# Patient Record
Sex: Female | Born: 1980 | Race: White | Hispanic: Yes | Marital: Single | Smoking: Never smoker
Health system: Southern US, Community
[De-identification: ages and names within clinical notes are randomized; demographics above are authoritative.]

## PROBLEM LIST (undated history)

## (undated) DIAGNOSIS — E7800 Pure hypercholesterolemia, unspecified: Secondary | ICD-10-CM

## (undated) DIAGNOSIS — E079 Disorder of thyroid, unspecified: Secondary | ICD-10-CM

## (undated) HISTORY — DX: Pure hypercholesterolemia, unspecified: E78.00

## (undated) HISTORY — DX: Disorder of thyroid, unspecified: E07.9

---

## 2006-08-25 ENCOUNTER — Emergency Department (HOSPITAL_COMMUNITY): Admission: EM | Admit: 2006-08-25 | Discharge: 2006-08-25 | Payer: Self-pay | Admitting: Emergency Medicine

## 2006-10-27 ENCOUNTER — Emergency Department (HOSPITAL_COMMUNITY): Admission: EM | Admit: 2006-10-27 | Discharge: 2006-10-28 | Payer: Self-pay | Admitting: Emergency Medicine

## 2007-05-08 ENCOUNTER — Emergency Department (HOSPITAL_COMMUNITY): Admission: EM | Admit: 2007-05-08 | Discharge: 2007-05-08 | Payer: Self-pay | Admitting: Emergency Medicine

## 2007-08-03 ENCOUNTER — Emergency Department (HOSPITAL_COMMUNITY): Admission: EM | Admit: 2007-08-03 | Discharge: 2007-08-03 | Payer: Self-pay | Admitting: Emergency Medicine

## 2007-10-23 ENCOUNTER — Emergency Department (HOSPITAL_COMMUNITY): Admission: EM | Admit: 2007-10-23 | Discharge: 2007-10-23 | Payer: Self-pay | Admitting: Emergency Medicine

## 2007-10-25 ENCOUNTER — Emergency Department (HOSPITAL_COMMUNITY): Admission: EM | Admit: 2007-10-25 | Discharge: 2007-10-25 | Payer: Self-pay | Admitting: Emergency Medicine

## 2009-07-06 ENCOUNTER — Emergency Department (HOSPITAL_COMMUNITY): Admission: EM | Admit: 2009-07-06 | Discharge: 2009-07-06 | Payer: Self-pay | Admitting: Emergency Medicine

## 2009-12-03 ENCOUNTER — Inpatient Hospital Stay (HOSPITAL_COMMUNITY): Admission: EM | Admit: 2009-12-03 | Discharge: 2009-12-04 | Payer: Self-pay | Admitting: Emergency Medicine

## 2009-12-03 ENCOUNTER — Ambulatory Visit: Payer: Self-pay | Admitting: Family Medicine

## 2010-01-06 ENCOUNTER — Emergency Department (HOSPITAL_COMMUNITY): Admission: EM | Admit: 2010-01-06 | Discharge: 2010-01-07 | Payer: Self-pay | Admitting: Emergency Medicine

## 2010-11-15 LAB — BASIC METABOLIC PANEL
CO2: 23 mEq/L (ref 19–32)
Chloride: 101 mEq/L (ref 96–112)
GFR calc Af Amer: >60 mL/min (ref >60)
GFR calc non Af Amer: >60 mL/min (ref >60)

## 2010-11-15 LAB — HEPATIC FUNCTION PANEL
AST: 17 U/L (ref 0–37)
Albumin: 3.6 g/dL (ref 3.5–5.2)
Alkaline Phosphatase: 74 U/L (ref 39–117)

## 2010-11-15 LAB — POCT I-STAT, CHEM 8
Calcium, Ion: 1.1 mmol/L — ABNORMAL LOW (ref 1.12–1.32)
Creatinine, Ser: 0.7 mg/dL (ref 0.4–1.2)
Glucose, Bld: 103 mg/dL — ABNORMAL HIGH (ref 70–99)
HCT: 51 % — ABNORMAL HIGH (ref 36.0–46.0)
Hemoglobin: 17.3 g/dL — ABNORMAL HIGH (ref 12.0–15.0)
TCO2: 25 mmol/L (ref 0–100)

## 2010-11-15 LAB — URINALYSIS, ROUTINE W REFLEX MICROSCOPIC
Bilirubin Urine: NEGATIVE
Ketones, ur: 15 mg/dL — AB
Nitrite: NEGATIVE
Specific Gravity, Urine: 1.006 (ref 1.005–1.030)

## 2010-11-15 LAB — LIPASE, BLOOD: Lipase: 18 U/L (ref 11–59)

## 2010-11-15 LAB — CBC
Platelets: 307 10*3/uL (ref 150–400)
RBC: 4.98 MIL/uL (ref 3.87–5.11)
WBC: 12.8 10*3/uL — ABNORMAL HIGH (ref 4.0–10.5)

## 2010-11-15 LAB — DIFFERENTIAL
Basophils Relative: 0 % (ref 0–1)
Eosinophils Relative: 0 % (ref 0–5)
Lymphocytes Relative: 4 % — ABNORMAL LOW (ref 12–46)

## 2010-11-15 LAB — URINE MICROSCOPIC-ADD ON

## 2010-11-15 LAB — POCT PREGNANCY, URINE: Preg Test, Ur: NEGATIVE

## 2010-11-16 LAB — CBC
HCT: 38.2 % (ref 36.0–46.0)
Hemoglobin: 13 g/dL (ref 12.0–15.0)
Hemoglobin: 14.3 g/dL (ref 12.0–15.0)
MCV: 87.1 fL (ref 78.0–100.0)
MCV: 87.4 fL (ref 78.0–100.0)
Platelets: 259 10*3/uL (ref 150–400)
RBC: 4.38 MIL/uL (ref 3.87–5.11)
RDW: 14.2 % (ref 11.5–15.5)
WBC: 10.5 10*3/uL (ref 4.0–10.5)
WBC: 11.4 10*3/uL — ABNORMAL HIGH (ref 4.0–10.5)

## 2010-11-16 LAB — BASIC METABOLIC PANEL
Calcium: 8.9 mg/dL (ref 8.4–10.5)
Calcium: 9 mg/dL (ref 8.4–10.5)
Chloride: 105 mEq/L (ref 96–112)
Creatinine, Ser: 0.65 mg/dL (ref 0.4–1.2)
GFR calc Af Amer: >60 mL/min (ref >60)
GFR calc non Af Amer: >60 mL/min (ref >60)
Glucose, Bld: 145 mg/dL — ABNORMAL HIGH (ref 70–99)
Potassium: 3.4 mEq/L — ABNORMAL LOW (ref 3.5–5.1)
Sodium: 134 mEq/L — ABNORMAL LOW (ref 135–145)
Sodium: 138 mEq/L (ref 135–145)

## 2010-11-16 LAB — DIFFERENTIAL
Basophils Absolute: 0 10*3/uL (ref 0.0–0.1)
Basophils Relative: 0 % (ref 0–1)
Lymphs Abs: 0.5 10*3/uL — ABNORMAL LOW (ref 0.7–4.0)
Monocytes Absolute: 0 10*3/uL — ABNORMAL LOW (ref 0.1–1.0)
Monocytes Relative: 0 % — ABNORMAL LOW (ref 3–12)

## 2010-11-16 LAB — MRSA PCR SCREENING: MRSA by PCR: NEGATIVE

## 2010-11-16 LAB — URINALYSIS, ROUTINE W REFLEX MICROSCOPIC
Hgb urine dipstick: NEGATIVE
Nitrite: NEGATIVE
Specific Gravity, Urine: 1.025 (ref 1.005–1.030)
pH: 5.5 (ref 5.0–8.0)

## 2010-11-16 LAB — PREGNANCY, URINE: Preg Test, Ur: NEGATIVE

## 2011-05-19 LAB — INFLUENZA A+B VIRUS AG-DIRECT(RAPID): Inflenza A Ag: NEGATIVE

## 2011-05-19 LAB — RAPID STREP SCREEN (MED CTR MEBANE ONLY): Streptococcus, Group A Screen (Direct): NEGATIVE

## 2011-08-19 ENCOUNTER — Inpatient Hospital Stay (HOSPITAL_COMMUNITY)
Admission: EM | Admit: 2011-08-19 | Discharge: 2011-08-21 | DRG: 690 | Disposition: A | Discharge status: Home / Self Care | Payer: Self-pay | Attending: Internal Medicine | Admitting: Internal Medicine

## 2011-08-19 ENCOUNTER — Other Ambulatory Visit: Payer: Self-pay

## 2011-08-19 ENCOUNTER — Encounter (HOSPITAL_COMMUNITY): Payer: Self-pay | Admitting: Internal Medicine

## 2011-08-19 DIAGNOSIS — E876 Hypokalemia: Secondary | ICD-10-CM | POA: Diagnosis present

## 2011-08-19 DIAGNOSIS — R509 Fever, unspecified: Secondary | ICD-10-CM | POA: Diagnosis present

## 2011-08-19 DIAGNOSIS — J45909 Unspecified asthma, uncomplicated: Secondary | ICD-10-CM | POA: Diagnosis present

## 2011-08-19 DIAGNOSIS — N12 Tubulo-interstitial nephritis, not specified as acute or chronic: Principal | ICD-10-CM | POA: Diagnosis present

## 2011-08-19 DIAGNOSIS — G43909 Migraine, unspecified, not intractable, without status migrainosus: Secondary | ICD-10-CM | POA: Diagnosis present

## 2011-08-19 DIAGNOSIS — N10 Acute tubulo-interstitial nephritis: Secondary | ICD-10-CM

## 2011-08-19 DIAGNOSIS — R000 Tachycardia, unspecified: Secondary | ICD-10-CM | POA: Diagnosis present

## 2011-08-19 DIAGNOSIS — E860 Dehydration: Secondary | ICD-10-CM | POA: Diagnosis present

## 2011-08-19 LAB — URINALYSIS, ROUTINE W REFLEX MICROSCOPIC
Bilirubin Urine: NEGATIVE
Glucose, UA: NEGATIVE mg/dL
Ketones, ur: NEGATIVE mg/dL
Nitrite: NEGATIVE
Protein, ur: 100 mg/dL — AB
pH: 5.5 (ref 5.0–8.0)

## 2011-08-19 LAB — URINE MICROSCOPIC-ADD ON

## 2011-08-19 LAB — CBC
HCT: 40 % (ref 36.0–46.0)
MCH: 29.2 pg (ref 26.0–34.0)
MCV: 86.4 fL (ref 78.0–100.0)
Platelets: 233 10*3/uL (ref 150–400)
RDW: 13.9 % (ref 11.5–15.5)

## 2011-08-19 LAB — BASIC METABOLIC PANEL
BUN: 12 mg/dL (ref 6–23)
Calcium: 9.4 mg/dL (ref 8.4–10.5)
Chloride: 103 mEq/L (ref 96–112)
Creatinine, Ser: 0.73 mg/dL (ref 0.50–1.10)
GFR calc Af Amer: >90 mL/min (ref >90)

## 2011-08-19 MED ORDER — MORPHINE SULFATE 2 MG/ML IJ SOLN
1.0000 mg | INTRAMUSCULAR | Status: DC | PRN
  Administered 2011-08-20 (×2): 1 mg via INTRAVENOUS
  Filled 2011-08-19 (×2): qty 1

## 2011-08-19 MED ORDER — DEXTROSE 5 % IV SOLN
1.0000 g | INTRAVENOUS | Status: DC
  Filled 2011-08-19: qty 10

## 2011-08-19 MED ORDER — OXYCODONE HCL 5 MG PO TABS
5.0000 mg | ORAL_TABLET | ORAL | Status: DC | PRN
  Administered 2011-08-19 – 2011-08-20 (×2): 5 mg via ORAL
  Filled 2011-08-19 (×2): qty 1

## 2011-08-19 MED ORDER — IBUPROFEN 800 MG PO TABS
800.0000 mg | ORAL_TABLET | Freq: Once | ORAL | Status: AC
  Administered 2011-08-19: 800 mg via ORAL
  Filled 2011-08-19: qty 2

## 2011-08-19 MED ORDER — SODIUM CHLORIDE 0.9 % IV BOLUS (SEPSIS)
1000.0000 mL | INTRAVENOUS | Status: AC
  Administered 2011-08-19 (×2): 1000 mL via INTRAVENOUS

## 2011-08-19 MED ORDER — ALBUTEROL SULFATE (5 MG/ML) 0.5% IN NEBU: 2.5000 mg | INHALATION_SOLUTION | RESPIRATORY_TRACT | Status: DC | PRN

## 2011-08-19 MED ORDER — MORPHINE SULFATE 4 MG/ML IJ SOLN: 4.0000 mg | INTRAMUSCULAR | Status: DC | PRN

## 2011-08-19 MED ORDER — OXYCODONE-ACETAMINOPHEN 5-325 MG PO TABS
2.0000 | ORAL_TABLET | Freq: Once | ORAL | Status: DC
  Filled 2011-08-19: qty 2

## 2011-08-19 MED ORDER — PNEUMOCOCCAL VAC POLYVALENT 25 MCG/0.5ML IJ INJ
0.5000 mL | INJECTION | INTRAMUSCULAR | Status: AC
  Administered 2011-08-20: 0.5 mL via INTRAMUSCULAR
  Filled 2011-08-19: qty 0.5

## 2011-08-19 MED ORDER — SODIUM CHLORIDE 0.9 % IV BOLUS (SEPSIS)
1000.0000 mL | Freq: Once | INTRAVENOUS | Status: AC
  Administered 2011-08-19: 1000 mL via INTRAVENOUS

## 2011-08-19 MED ORDER — ONDANSETRON HCL 4 MG/2ML IJ SOLN
4.0000 mg | Freq: Four times a day (QID) | INTRAMUSCULAR | Status: DC | PRN
  Administered 2011-08-21: 4 mg via INTRAVENOUS
  Filled 2011-08-19: qty 2

## 2011-08-19 MED ORDER — SODIUM CHLORIDE 0.9 % IV SOLN: INTRAVENOUS | Status: DC

## 2011-08-19 MED ORDER — ACETAMINOPHEN 325 MG PO TABS
650.0000 mg | ORAL_TABLET | Freq: Once | ORAL | Status: AC
  Administered 2011-08-19: 650 mg via ORAL
  Filled 2011-08-19: qty 2

## 2011-08-19 MED ORDER — SENNOSIDES-DOCUSATE SODIUM 8.6-50 MG PO TABS
1.0000 | ORAL_TABLET | Freq: Every evening | ORAL | Status: DC | PRN
  Filled 2011-08-19: qty 1

## 2011-08-19 MED ORDER — SODIUM CHLORIDE 0.9 % IV SOLN
INTRAVENOUS | Status: DC
  Administered 2011-08-20: 10:00:00 via INTRAVENOUS

## 2011-08-19 MED ORDER — ONDANSETRON HCL 4 MG/2ML IJ SOLN: 4.0000 mg | Freq: Three times a day (TID) | INTRAMUSCULAR | Status: DC | PRN

## 2011-08-19 MED ORDER — DEXTROSE 5 % IV SOLN
1.0000 g | INTRAVENOUS | Status: DC
  Administered 2011-08-19 – 2011-08-20 (×2): 1 g via INTRAVENOUS
  Filled 2011-08-19 (×2): qty 10

## 2011-08-19 MED ORDER — ENOXAPARIN SODIUM 40 MG/0.4ML ~~LOC~~ SOLN
40.0000 mg | SUBCUTANEOUS | Status: DC
  Administered 2011-08-19 – 2011-08-20 (×2): 40 mg via SUBCUTANEOUS
  Filled 2011-08-19 (×3): qty 0.4

## 2011-08-19 MED ORDER — ONDANSETRON HCL 4 MG PO TABS: 4.0000 mg | ORAL_TABLET | Freq: Four times a day (QID) | ORAL | Status: DC | PRN

## 2011-08-19 MED ORDER — ACETAMINOPHEN 650 MG RE SUPP: 650.0000 mg | Freq: Four times a day (QID) | RECTAL | Status: DC | PRN

## 2011-08-19 MED ORDER — OXYCODONE-ACETAMINOPHEN 5-325 MG PO TABS
1.0000 | ORAL_TABLET | Freq: Once | ORAL | Status: AC
  Administered 2011-08-19: 1 via ORAL

## 2011-08-19 MED ORDER — ACETAMINOPHEN 325 MG PO TABS
650.0000 mg | ORAL_TABLET | Freq: Four times a day (QID) | ORAL | Status: DC | PRN
  Administered 2011-08-20 – 2011-08-21 (×2): 650 mg via ORAL
  Filled 2011-08-19 (×2): qty 2

## 2011-08-19 MED ORDER — MORPHINE SULFATE 2 MG/ML IJ SOLN
2.0000 mg | Freq: Once | INTRAMUSCULAR | Status: AC
  Administered 2011-08-19: 2 mg via INTRAVENOUS
  Filled 2011-08-19: qty 1

## 2011-08-19 MED ORDER — INFLUENZA VIRUS VACC SPLIT PF IM SUSP
0.5000 mL | INTRAMUSCULAR | Status: AC
  Administered 2011-08-20: 0.5 mL via INTRAMUSCULAR
  Filled 2011-08-19: qty 0.5

## 2011-08-19 MED ORDER — DEXTROSE 5 % IV SOLN
1.0000 g | Freq: Once | INTRAVENOUS | Status: AC
  Administered 2011-08-19: 1 g via INTRAVENOUS
  Filled 2011-08-19: qty 10

## 2011-08-19 MED ORDER — ONDANSETRON HCL 4 MG/2ML IJ SOLN
4.0000 mg | Freq: Once | INTRAMUSCULAR | Status: AC
  Administered 2011-08-19: 4 mg via INTRAVENOUS
  Filled 2011-08-19: qty 2

## 2011-08-19 NOTE — ED Provider Notes (Signed)
Pt presents with flank pain, fever, abdominal ttp. Physical Exam  BP 123/45  Pulse 164  Temp(Src) 103.2 F (39.6 C) (Oral)  Resp 22  SpO2 100%  Physical Exam  Nursing note and vitals reviewed. Constitutional:  Non-toxic appearance. She appears distressed.  Eyes: Conjunctivae are normal. Pupils are equal, round, and reactive to light.  Neck: Neck supple.  Cardiovascular: Regular rhythm and normal heart sounds.   No murmur heard. Pulmonary/Chest: Breath sounds normal.  Abdominal: She exhibits no distension and no mass. There is tenderness in the right lower quadrant. There is guarding. There is no rebound.  Skin: Skin is warm. No rash noted. She is not diaphoretic. No pallor.    ED Course  Procedures  MDM Pt with pyelo but does have persistent borderline low BP despite IV abx and fluids.   Will admit    I saw and evaluated the patient, reviewed the resident's note.  Please see my note above.  Celene Kras, MD 08/19/11 [phone]

## 2011-08-19 NOTE — ED Notes (Signed)
Spoke to physician r/t 2 Percocet ordered with recent dose of Tylenol. Order reduced to one Percocet.

## 2011-08-19 NOTE — ED Provider Notes (Signed)
History     CSN: [id number]  Arrival date & time 08/19/11  0945   First MD Initiated Contact with Patient 08/19/11 [phone]      Chief Complaint  Patient presents with  . Back Pain  . Headache  . Fever   HPI Pt is a 30 year old female with PMHx significant of asthma who presents today with 3 days of progressively worsening burning with urination, flank pain, fevers/chills, and headache.  Pt also reports problems with nausea.  No chest pain or problems breathing.  Pain is primarily in the left flank, although there is some in the right flank as well, is constant and very severe.  Pt had LMP at end of November, was normal.  Uses no contraception.  Currently has small amount of yellowish vaginal discharge along with significant dysuria and frequency.  No past medical history on file.  No past surgical history on file.  No family history on file.  History  Substance Use Topics  . Smoking status: Not on file  . Smokeless tobacco: Not on file  . Alcohol Use: Not on file    OB History    No data available      Review of Systems  Constitutional: Positive for fever, chills, diaphoresis and appetite change.  HENT: Negative for congestion, sore throat, sneezing and neck pain.   Eyes: Negative.   Respiratory: Negative.   Cardiovascular: Negative.   Gastrointestinal: Positive for nausea. Negative for vomiting and diarrhea.  Genitourinary: Positive for dysuria, urgency, frequency, flank pain and vaginal discharge. Negative for difficulty urinating, vaginal pain and pelvic pain.  Musculoskeletal: Positive for back pain. Negative for myalgias and joint swelling.  Skin: Negative.   Neurological: Positive for headaches. Negative for dizziness and weakness.  Hematological: Negative.     Allergies  Review of patient's allergies indicates no known allergies.  Home Medications  No current outpatient prescriptions on file.  BP 96/48  Pulse 114  Temp(Src) 99.9 F (37.7 C) (Oral)  Resp  22  SpO2 94%  Physical Exam  Constitutional: She is oriented to person, place, and time. She appears well-developed and well-nourished. She appears distressed.  HENT:  Head: Normocephalic and atraumatic.  Right Ear: External ear normal.  Left Ear: External ear normal.  Nose: Nose normal.  Eyes: Conjunctivae and EOM are normal.  Neck: Normal range of motion. Neck supple.  Cardiovascular: Regular rhythm and normal heart sounds.   No murmur heard.      tachycardic  Pulmonary/Chest: Effort normal and breath sounds normal. No respiratory distress. She has no wheezes.  Abdominal: Soft. Bowel sounds are normal. She exhibits no distension. There is tenderness. There is CVA tenderness. There is no rigidity, no rebound and no guarding.       Some tenderness in RLQ  Musculoskeletal: Normal range of motion. She exhibits no edema.  Neurological: She is alert and oriented to person, place, and time. No cranial nerve deficit.  Skin: Skin is warm. She is diaphoretic. No erythema.  Psychiatric: She has a normal mood and affect.    ED Course  Procedures (including critical care time)  Labs Reviewed  CBC - Abnormal; Notable for the following:    WBC 14.2 (*)    All other components within normal limits  BASIC METABOLIC PANEL - Abnormal; Notable for the following:    Glucose, Bld 139 (*)    All other components within normal limits  URINALYSIS, ROUTINE W REFLEX MICROSCOPIC - Abnormal; Notable for the following:  APPearance TURBID (*)    Hgb urine dipstick MODERATE (*)    Protein, ur 100 (*)    Leukocytes, UA LARGE (*)    All other components within normal limits  URINE MICROSCOPIC-ADD ON - Abnormal; Notable for the following:    Bacteria, UA FEW (*)    All other components within normal limits  POCT PREGNANCY, URINE  POCT PREGNANCY, URINE  URINE CULTURE   No results found.   No diagnosis found.    MDM  Have spoken with [facility] who will admit patient due to persistent tachycardia,  nausea, and borderline hypotension.        Majel Homer, MD 08/19/11 1500

## 2011-08-19 NOTE — ED Notes (Signed)
Patient rates pain 2/10 and is resting comfortably.

## 2011-08-19 NOTE — ED Notes (Signed)
Translator at bedside, does not speak [place]. Abd and back pain, denies v/d, some nausea w/o emesis. Last period 9 days ago, does not use birth control.

## 2011-08-19 NOTE — ED Provider Notes (Signed)
Medical screening examination/treatment/procedure(s) were performed by non-physician practitioner and as supervising physician I was immediately available for consultation/collaboration.   Hisayo Delossantos R Komal Stangelo, MD 08/19/11 1642 

## 2011-08-19 NOTE — ED Notes (Signed)
Due to increased HR (around 170), will move to acute room, Resident MD saw pt briefly.

## 2011-08-19 NOTE — H&P (Signed)
PCP:   No primary provider on file.   Chief Complaint:  Right flank pain, fever, dysuria  HPI: Patient is a pleasant 30 year old Spanish speaking female originally from [place] who has been living in the [place] for 5 years who only has past medical history significant for mild intermittent asthma who cannot even remember the last time she used albuterol, presents to the hospital with a five-day history of right flank pain, dysuria and fever. Today she developed nausea and vomiting and because of this she decided to come into the hospital for further evaluation. In the emergency department she is found to have a temperature of 103.2, a heart rate of 164 initially as well as positive right CVA tenderness and a very dirty urine and because of this we're asked to admit her for further evaluation and management.  Allergies:  No Known Allergies    Past Medical History  Diagnosis Date  . Asthma     History reviewed. No pertinent past surgical history.  Prior to Admission medications   Not on File    Social History:  reports that she has never smoked. She has never used smokeless tobacco. She reports that she does not drink alcohol or use illicit drugs.  History reviewed. No pertinent family history.  Review of Systems:  Negative except as mentioned in history of present illness.  Physical Exam: Blood pressure 95/53, pulse 115, temperature 97.9 F (36.6 C), temperature source Oral, resp. rate 18, SpO2 99.00%. General: Alert, awake, oriented x3 HEENT: Normocephalic, atraumatic, pupils equal round and reactive to light, intact extraocular movements, dry mucous membranes. Neck: Supple, no JVD, no lymphadenopathy, no bruits, no goiter. Cardiovascular: Tachycardic but with regular rhythm, no murmurs, rubs or gallops. Lungs: Clear to auscultation bilaterally. Abdomen: Soft, nondistended, positive bowel sounds, no masses or organomegaly noted, she does have positive suprapubic  tenderness and CVA tenderness over the right flank. Extremities: No clubbing, cyanosis or edema, positive pedal pulses. Neurologic: Grossly intact and nonfocal.   Labs on Admission:  Results for orders placed during the hospital encounter of 08/19/11 (from the past 48 hour(s))  CBC     Status: Abnormal   Collection Time   08/19/11  9:56 AM      Component Value Range Comment   WBC 14.2 (*) 4.0 - 10.5 (K/uL)    RBC 4.63  3.87 - 5.11 (MIL/uL)    Hemoglobin 13.5  12.0 - 15.0 (g/dL)    HCT 16.1  09.6 - 04.5 (%)    MCV 86.4  78.0 - 100.0 (fL)    MCH 29.2  26.0 - 34.0 (pg)    MCHC 33.8  30.0 - 36.0 (g/dL)    RDW 40.9  81.1 - 91.4 (%)    Platelets 233  150 - 400 (K/uL)   BASIC METABOLIC PANEL     Status: Abnormal   Collection Time   08/19/11  9:56 AM      Component Value Range Comment   Sodium 135  135 - 145 (mEq/L)    Potassium 3.6  3.5 - 5.1 (mEq/L)    Chloride 103  96 - 112 (mEq/L)    CO2 21  19 - 32 (mEq/L)    Glucose, Bld 139 (*) 70 - 99 (mg/dL)    BUN 12  6 - 23 (mg/dL)    Creatinine, Ser 7.82  0.50 - 1.10 (mg/dL)    Calcium 9.4  8.4 - 10.5 (mg/dL)    GFR calc non Af Amer >90  >  90 (mL/min)    GFR calc Af Amer >90  >90 (mL/min)   URINALYSIS, ROUTINE W REFLEX MICROSCOPIC     Status: Abnormal   Collection Time   08/19/11 10:19 AM      Component Value Range Comment   Color, Urine YELLOW  YELLOW     APPearance TURBID (*) CLEAR     Specific Gravity, Urine 1.013  1.005 - 1.030     pH 5.5  5.0 - 8.0     Glucose, UA NEGATIVE  NEGATIVE (mg/dL)    Hgb urine dipstick MODERATE (*) NEGATIVE     Bilirubin Urine NEGATIVE  NEGATIVE     Ketones, ur NEGATIVE  NEGATIVE (mg/dL)    Protein, ur 161 (*) NEGATIVE (mg/dL)    Urobilinogen, UA 0.2  0.0 - 1.0 (mg/dL)    Nitrite NEGATIVE  NEGATIVE     Leukocytes, UA LARGE (*) NEGATIVE    URINE MICROSCOPIC-ADD ON     Status: Abnormal   Collection Time   08/19/11 10:19 AM      Component Value Range Comment   WBC, UA TOO NUMEROUS TO COUNT  <3  (WBC/hpf)    RBC / HPF 0-2  <3 (RBC/hpf)    Bacteria, UA FEW (*) RARE     Urine-Other MICROSCOPIC EXAM PERFORMED ON UNCONCENTRATED URINE     POCT PREGNANCY, URINE     Status: Normal   Collection Time   08/19/11 10:29 AM      Component Value Range Comment   Preg Test, Ur NEGATIVE       Radiological Exams on Admission: No results found.  Assessment/Plan Principal Problem:  *Pyelonephritis Active Problems:  Tachycardia  Asthma   #1 pyelonephritis: Agree with Rocephin that has been started in the emergency department. Urine culture ordered and pending.  #2 tachycardia: Suspect related to dehydration secondary to nausea and vomiting. Has been aggressively volume repleted in the emergency department her heart rate is Dr. 114. We'll continue IV fluids.  #3 DVT prophylaxis: Lovenox.  Time Spent on Admission: 40 minutes.  Chaya Jan [facility] Pager: [phone] 08/19/2011, 4:11 PM

## 2011-08-19 NOTE — ED Notes (Signed)
Floor called to give report, nurse in room, Dawn will call back.

## 2011-08-20 ENCOUNTER — Inpatient Hospital Stay (HOSPITAL_COMMUNITY): Payer: Self-pay

## 2011-08-20 LAB — URINE CULTURE: Culture  Setup Time: 201212222133

## 2011-08-20 LAB — CBC
MCH: 28.9 pg (ref 26.0–34.0)
MCHC: 33.2 g/dL (ref 30.0–36.0)
Platelets: 185 10*3/uL (ref 150–400)

## 2011-08-20 LAB — BASIC METABOLIC PANEL
BUN: 7 mg/dL (ref 6–23)
Calcium: 7 mg/dL — ABNORMAL LOW (ref 8.4–10.5)
Creatinine, Ser: 0.57 mg/dL (ref 0.50–1.10)
GFR calc non Af Amer: >90 mL/min (ref >90)
Glucose, Bld: 88 mg/dL (ref 70–99)
Sodium: 136 mEq/L (ref 135–145)

## 2011-08-20 MED ORDER — POTASSIUM CHLORIDE CRYS ER 20 MEQ PO TBCR
40.0000 meq | EXTENDED_RELEASE_TABLET | ORAL | Status: AC
  Administered 2011-08-20 (×2): 40 meq via ORAL
  Filled 2011-08-20 (×4): qty 2

## 2011-08-20 NOTE — Progress Notes (Signed)
Subjective: NO fever, no N/V and tolerating PO's. Also complaining of HA with associated vision changes and  ?? galactorrea  Objective: Vital signs in last 24 hours: Temp:  [97.5 F (36.4 C)-98.6 F (37 C)] 98.3 F (36.8 C) (12/23 0658) Pulse Rate:  [96-115] 96  (12/23 0658) Resp:  [18-20] 18  (12/23 0658) BP: (91-97)/(53-66) 91/60 mmHg (12/23 0658) SpO2:  [97 %-99 %] 98 % (12/23 0658) Weight:  [64.4 kg (141 lb 15.6 oz)-65.363 kg (144 lb 1.6 oz)] 141 lb 15.6 oz (64.4 kg) (12/23 0658) Weight change:  Last BM Date: 08/16/11  Intake/Output from previous day: 12/22 0701 - 12/23 0700 In: 5282 [P.O.:1032; I.V.:4250] Out: -      Physical Exam: General: Alert, awake, oriented x3, in no significant distress. HEENT: No bruits, no goiter. Heart: Regular rate and rhythm, without murmurs, rubs, gallops. Lungs: Clear to auscultation bilaterally. Abdomen: Soft, nontender, nondistended, positive bowel sounds. Extremities: No clubbing cyanosis or edema with positive pedal pulses. Neuro: Grossly intact, nonfocal.   Lab Results: Basic Metabolic Panel:  Basename 08/20/11 0528 08/19/11 0956  NA 136 135  K 3.3* 3.6  CL 108 103  CO2 20 21  GLUCOSE 88 139*  BUN 7 12  CREATININE 0.57 0.73  CALCIUM 7.0* 9.4  MG -- --  PHOS -- --   CBC:  Basename 08/20/11 0528 08/19/11 0956  WBC 16.4* 14.2*  NEUTROABS -- --  HGB 10.5* 13.5  HCT 31.6* 40.0  MCV 87.1 86.4  PLT 185 233    Studies/Results: No results found.   Medications: Scheduled Meds:   . cefTRIAXone (ROCEPHIN)  IV  1 g Intravenous Q24H  . enoxaparin  40 mg Subcutaneous Q24H  . influenza  inactive virus vaccine  0.5 mL Intramuscular Tomorrow-1000  . pneumococcal 23 valent vaccine  0.5 mL Intramuscular Tomorrow-1000  . potassium chloride  40 mEq Oral Q4H  . sodium chloride  1,000 mL Intravenous Q1 Hr x 2  . DISCONTD: sodium chloride   Intravenous STAT  . DISCONTD: cefTRIAXone (ROCEPHIN)  IV  1 g Intravenous Q24H    Continuous Infusions:   . sodium chloride 125 mL/hr at 08/20/11 1002   PRN Meds:.acetaminophen, acetaminophen, morphine, ondansetron (ZOFRAN) IV, ondansetron, oxyCODONE, senna-docusate, DISCONTD: albuterol, DISCONTD:  morphine injection, DISCONTD: ondansetron (ZOFRAN) IV  Assessment/Plan: 1-Pyelonephritis:continue IV rocephin for now; urine cx still pending. No fever and much better in general.  2-Tachycardia: 2/2 to dehydration now resolved. D/c Telemetry.  3-Asthma: stable; no wheezing or SOB.  4-Ha's with vision changes: will check CT of head, prolactin level and TSH.  5-DVT:lovenox    LOS: 1 day   Shyann Hefner [facility] [phone]  08/20/2011, 1:07 PM

## 2011-08-21 LAB — URINE CULTURE: Colony Count: >=100000

## 2011-08-21 LAB — MAGNESIUM: Magnesium: 2.3 mg/dL (ref 1.5–2.5)

## 2011-08-21 LAB — BASIC METABOLIC PANEL
BUN: 5 mg/dL — ABNORMAL LOW (ref 6–23)
Calcium: 9.2 mg/dL (ref 8.4–10.5)
GFR calc non Af Amer: >90 mL/min (ref >90)
Glucose, Bld: 94 mg/dL (ref 70–99)

## 2011-08-21 MED ORDER — TOPIRAMATE 25 MG PO TABS: 25.0000 mg | ORAL_TABLET | Freq: Two times a day (BID) | ORAL | Status: DC

## 2011-08-21 MED ORDER — ACETAMINOPHEN 500 MG PO TABS: 500.0000 mg | ORAL_TABLET | Freq: Four times a day (QID) | ORAL | Status: AC | PRN

## 2011-08-21 MED ORDER — CIPROFLOXACIN HCL 500 MG PO TABS: 500.0000 mg | ORAL_TABLET | Freq: Two times a day (BID) | ORAL | Status: AC

## 2011-08-21 NOTE — Plan of Care (Signed)
Problem: Discharge Progression Outcomes Goal: Barriers To Progression Addressed/Resolved Outcome: Completed/Met Date Met:  08/21/11 CT negative for source of headaches

## 2011-08-21 NOTE — Progress Notes (Signed)
UR CHART REVIEWED; B Kiona Blume Stofer RN, BSN, MHA 

## 2011-08-21 NOTE — Discharge Summary (Signed)
Physician Discharge Summary  Patient ID: Debra Hurley MRN: [id number] DOB/AGE: 30-Jan-1981 30 y.o.  Admit date: 08/19/2011 Discharge date: 08/21/2011  Primary Care Physician:  No primary provider on file.   Discharge Diagnoses:   1-Pyelonephritis 2-HA (presumed migraines) 3-Tachycardia, fever and dehydration 2/2 #1 4-Hypokalemia 2/2 volume contraction. 5-Hx of Asthma   Present on Admission:  .Pyelonephritis .Tachycardia .Asthma  Current Discharge Medication List    START taking these medications   Details  acetaminophen (TYLENOL) 500 MG tablet Take 1 tablet (500 mg total) by mouth every 6 (six) hours as needed for pain (or Fever >/= 101). Qty: 30 tablet    ciprofloxacin (CIPRO) 500 MG tablet Take 1 tablet (500 mg total) by mouth 2 (two) times daily. Qty: 16 tablet, Refills: 0    topiramate (TOPAMAX) 25 MG tablet Take 1 tablet (25 mg total) by mouth 2 (two) times daily. 1 tablet by mouth at bedtime daily for 1 week; then start taking ti twice a day. Qty: 60 tablet, Refills: 1         Disposition and Follow-up:  Patient discharge in stable and improved condition; currently without complaints of nausea, vomiting or Fever. She still have some Headache, but reports that is better. She has been instructed to arrange follow up with ophthalmologist in order to evaluate her vision and advise to follow discharge instructions/medications as prescribed. Patient was encouraged to get a primary care physician to follow over her health as an outpatient.  Consults:  None  Significant Diagnostic Studies:  No results found.  Brief H and P: 30 year old Spanish speaking female originally from [place] who has been living in the [place] for 5 years who only has past medical history significant for mild intermittent asthma who cannot even remember the last time she used albuterol, presents to the hospital with a five-day history of right flank pain, dysuria and fever. Today  she developed nausea and vomiting and because of this she decided to come into the hospital for further evaluation. In the emergency department she is found to have a temperature of 103.2, a heart rate of 164 initially as well as positive right CVA tenderness and a very dirty urine and because of this we're asked to admit her for further evaluation and management.       Hospital Course:  1-Pyelonephritis: urine cx failed to identified specific microorganism. Patient symptoms improved after 48 hours of IV antibiotics and fluid resuscitation. Now discharged home on ciprofloxacin to finish antibiotic therapy by mouth. Patient advised to keep herself hydrated.  2-HA (presumed migraines): negative CT, neg TSH and normal prolactin level. Ha presumed to be 2/2 to migraines or vision problems. Patient started on Topamax and advised to arrange follow up with ophthalmologist. Also encourage to establish relationship with a PCP for further follow up.  3-Tachycardia, fever and dehydration 2/2 #1: resolved at discharge; patient tolerating PO and doing ok overall.  4-Hypokalemia 2/2 volume contraction:Repleted and WNL at discharged.   5-Hx of Asthma: stable and no active at this point.   Time spent on Discharge: 40 minutes  Signed: Dagon Budai 08/21/2011, 10:56 AM

## 2012-01-27 IMAGING — CR DG CHEST 2V
2 series · 2 of 2 positions shown · non-contrast
Comparison: 10/24/2017

CLINICAL DATA: Asthma attack.  Shortness of breath.

CHEST - 2 VIEW

[w chest pa]
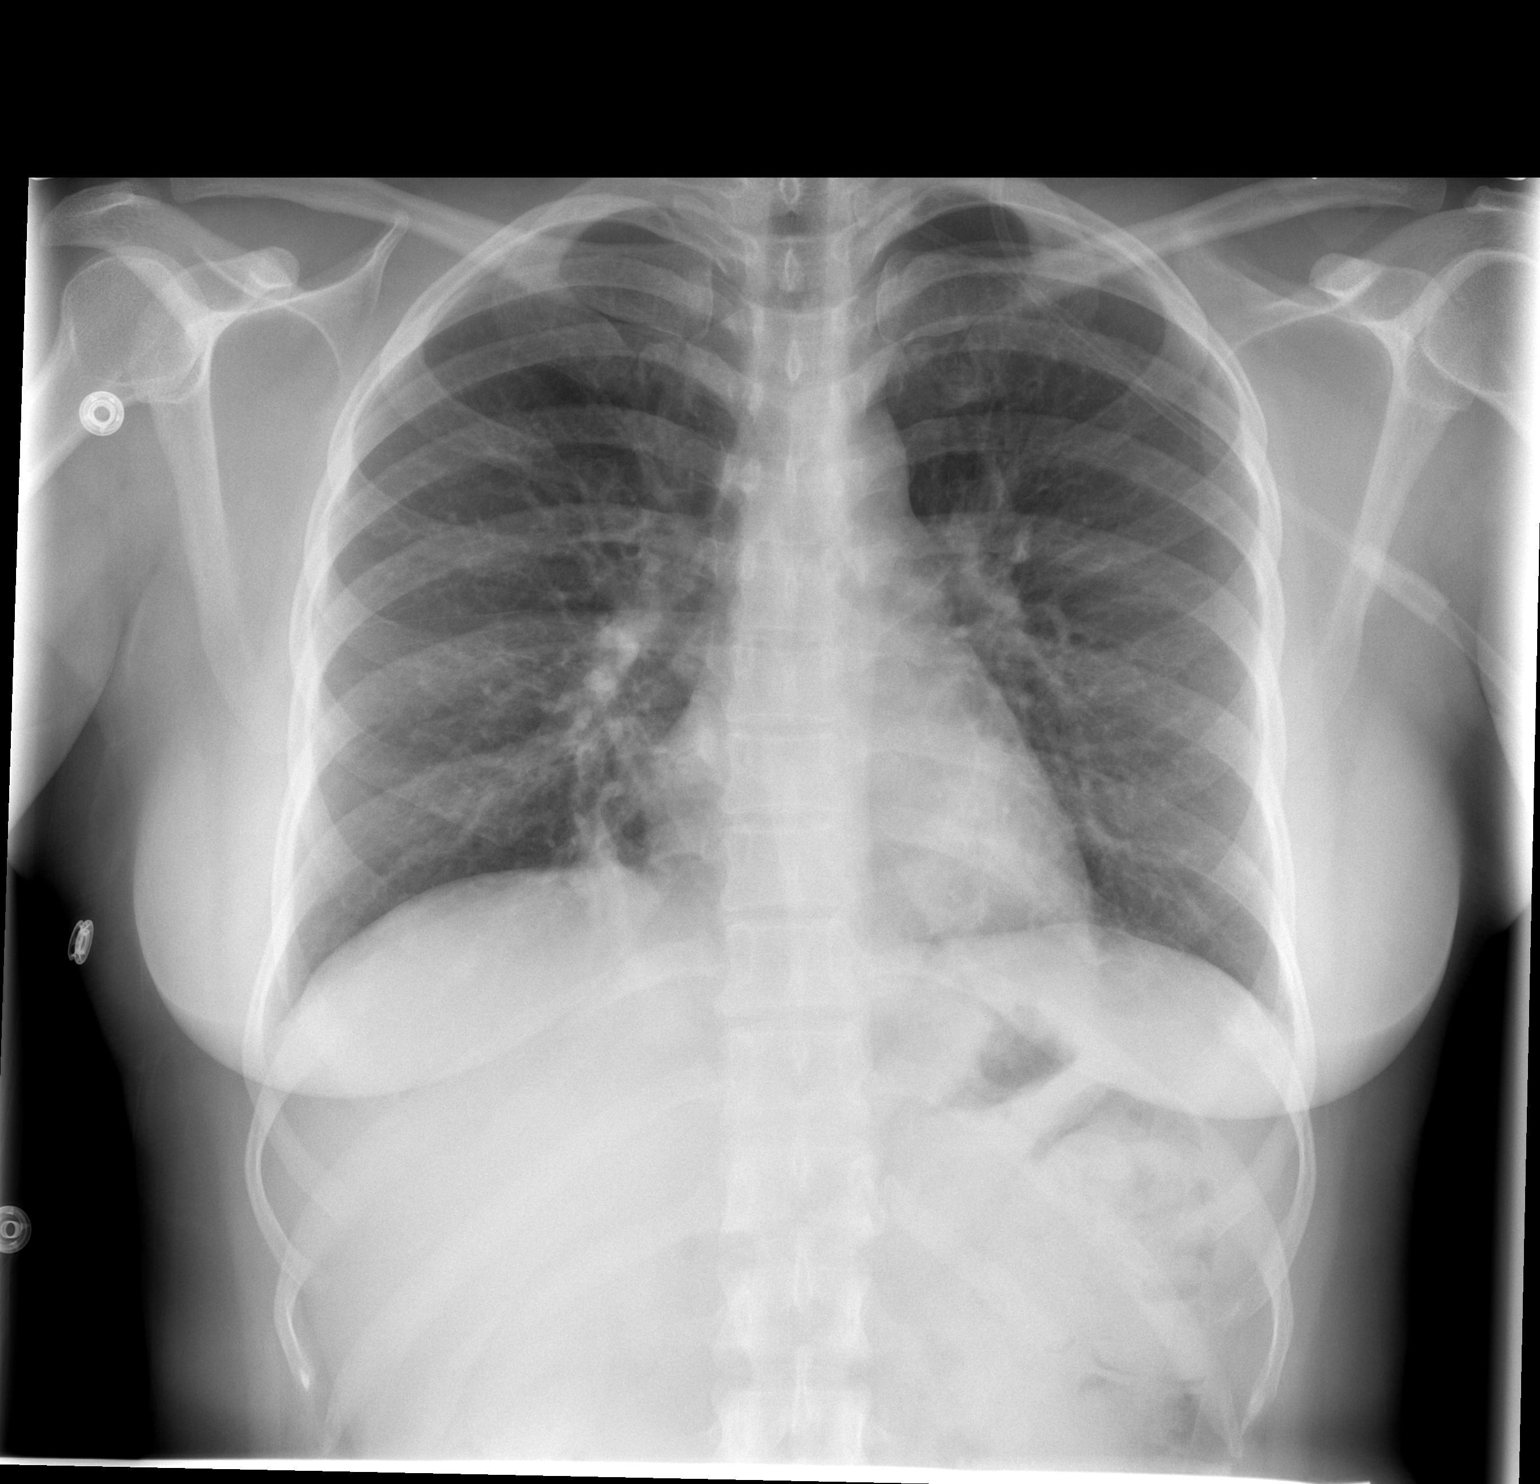

[w chest lat]
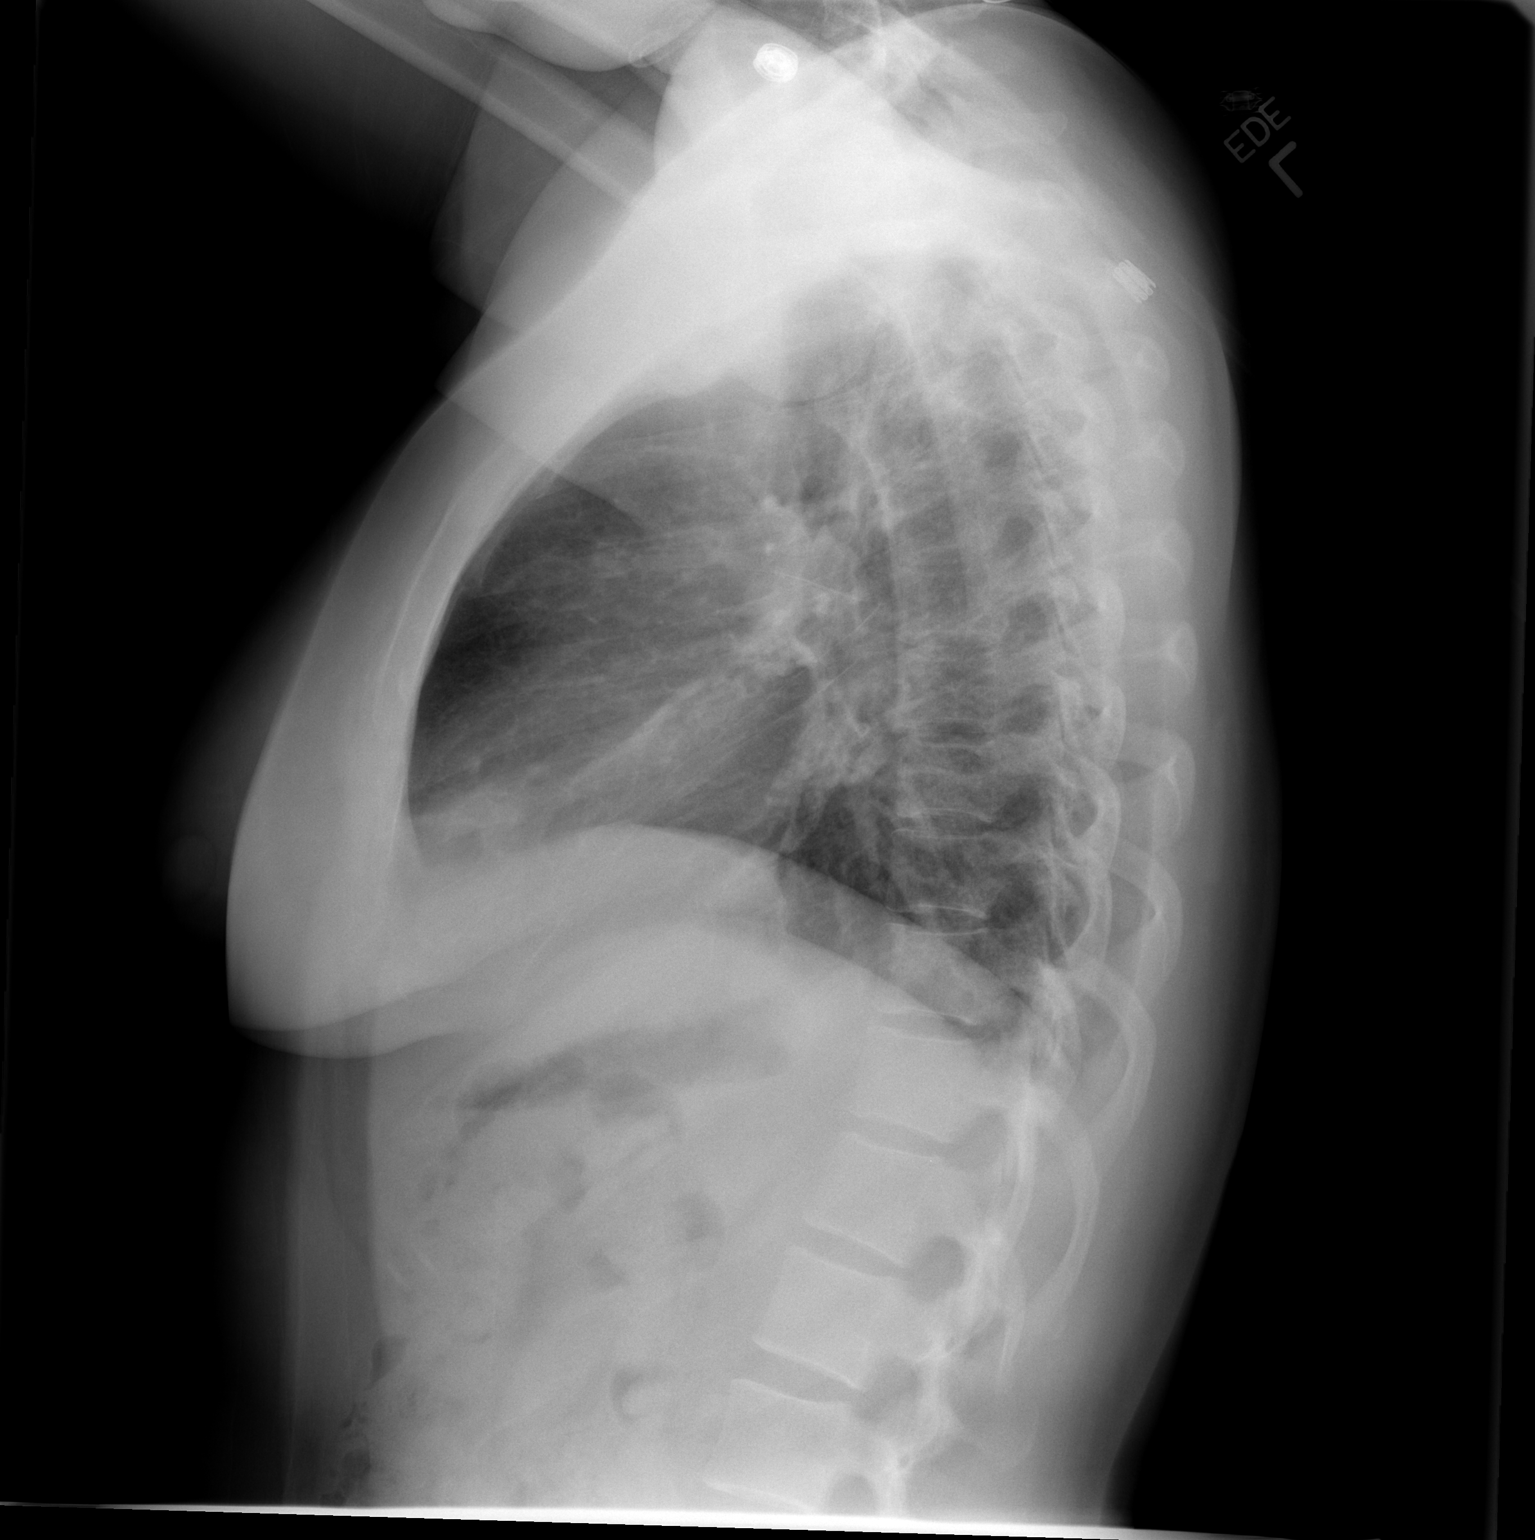

[2 of 2 positions shown; findings below may reference images not displayed]

FINDINGS: The cardiac silhouette, mediastinal and hilar contours
are within normal limits and stable.  Slightly low lung volumes
with vascular crowding and streaky bibasilar atelectasis.  Mild
peribronchial thickening.  No infiltrates, edema or effusions.  The
bony thorax is intact.
IMPRESSION: Mild peribronchial thickening and streaky areas of atelectasis.  No
infiltrates, edema or effusions.

## 2013-08-21 ENCOUNTER — Emergency Department (HOSPITAL_COMMUNITY)
Admission: EM | Admit: 2013-08-21 | Discharge: 2013-08-21 | Disposition: A | Discharge status: Home / Self Care | Payer: Self-pay | Attending: Emergency Medicine | Admitting: Emergency Medicine

## 2013-08-21 ENCOUNTER — Encounter (HOSPITAL_COMMUNITY): Payer: Self-pay | Admitting: Emergency Medicine

## 2013-08-21 DIAGNOSIS — M545 Low back pain, unspecified: Secondary | ICD-10-CM | POA: Insufficient documentation

## 2013-08-21 DIAGNOSIS — R000 Tachycardia, unspecified: Secondary | ICD-10-CM | POA: Insufficient documentation

## 2013-08-21 DIAGNOSIS — Z3202 Encounter for pregnancy test, result negative: Secondary | ICD-10-CM | POA: Insufficient documentation

## 2013-08-21 DIAGNOSIS — R319 Hematuria, unspecified: Secondary | ICD-10-CM | POA: Insufficient documentation

## 2013-08-21 DIAGNOSIS — N12 Tubulo-interstitial nephritis, not specified as acute or chronic: Secondary | ICD-10-CM | POA: Insufficient documentation

## 2013-08-21 DIAGNOSIS — J45909 Unspecified asthma, uncomplicated: Secondary | ICD-10-CM | POA: Insufficient documentation

## 2013-08-21 DIAGNOSIS — R509 Fever, unspecified: Secondary | ICD-10-CM | POA: Insufficient documentation

## 2013-08-21 LAB — CBC WITH DIFFERENTIAL/PLATELET
Basophils Absolute: 0 10*3/uL (ref 0.0–0.1)
HCT: 38.8 % (ref 36.0–46.0)
Hemoglobin: 13.2 g/dL (ref 12.0–15.0)
Lymphocytes Relative: 6 % — ABNORMAL LOW (ref 12–46)
Monocytes Absolute: 1 10*3/uL (ref 0.1–1.0)
Neutro Abs: 10.1 10*3/uL — ABNORMAL HIGH (ref 1.7–7.7)
RDW: 13.4 % (ref 11.5–15.5)
WBC: 11.9 10*3/uL — ABNORMAL HIGH (ref 4.0–10.5)

## 2013-08-21 LAB — COMPREHENSIVE METABOLIC PANEL
ALT: 18 U/L (ref 0–35)
AST: 19 U/L (ref 0–37)
Albumin: 3.9 g/dL (ref 3.5–5.2)
Alkaline Phosphatase: 77 U/L (ref 39–117)
BUN: 10 mg/dL (ref 6–23)
Chloride: 100 mEq/L (ref 96–112)
Potassium: 3.6 mEq/L (ref 3.5–5.1)
Sodium: 133 mEq/L — ABNORMAL LOW (ref 135–145)
Total Bilirubin: 0.7 mg/dL (ref 0.3–1.2)

## 2013-08-21 LAB — URINALYSIS, ROUTINE W REFLEX MICROSCOPIC
Bilirubin Urine: NEGATIVE
Nitrite: NEGATIVE
Specific Gravity, Urine: 1.013 (ref 1.005–1.030)
pH: 7.5 (ref 5.0–8.0)

## 2013-08-21 LAB — URINE MICROSCOPIC-ADD ON

## 2013-08-21 MED ORDER — ACETAMINOPHEN 500 MG PO TABS
1000.0000 mg | ORAL_TABLET | Freq: Once | ORAL | Status: AC
  Administered 2013-08-21: 1000 mg via ORAL
  Filled 2013-08-21: qty 2

## 2013-08-21 MED ORDER — MORPHINE SULFATE 4 MG/ML IJ SOLN
4.0000 mg | Freq: Once | INTRAMUSCULAR | Status: AC
  Administered 2013-08-21: 4 mg via INTRAVENOUS
  Filled 2013-08-21: qty 1

## 2013-08-21 MED ORDER — HYDROCODONE-ACETAMINOPHEN 5-325 MG PO TABS: 2.0000 | ORAL_TABLET | ORAL | Status: DC | PRN

## 2013-08-21 MED ORDER — SODIUM CHLORIDE 0.9 % IV BOLUS (SEPSIS)
1000.0000 mL | Freq: Once | INTRAVENOUS | Status: AC
  Administered 2013-08-21: 1000 mL via INTRAVENOUS

## 2013-08-21 MED ORDER — DEXTROSE 5 % IV SOLN
1.0000 g | Freq: Once | INTRAVENOUS | Status: AC
  Administered 2013-08-21: 1 g via INTRAVENOUS
  Filled 2013-08-21: qty 10

## 2013-08-21 MED ORDER — KETOROLAC TROMETHAMINE 30 MG/ML IJ SOLN
30.0000 mg | Freq: Once | INTRAMUSCULAR | Status: AC
Start: 2013-08-21 — End: 2013-08-21
  Administered 2013-08-21: 30 mg via INTRAVENOUS
  Filled 2013-08-21: qty 1

## 2013-08-21 MED ORDER — CEPHALEXIN 500 MG PO CAPS: 500.0000 mg | ORAL_CAPSULE | Freq: Four times a day (QID) | ORAL | Status: DC

## 2013-08-21 MED ORDER — SODIUM CHLORIDE 0.9 % IV BOLUS (SEPSIS)
500.0000 mL | Freq: Once | INTRAVENOUS | Status: AC
  Administered 2013-08-21: 500 mL via INTRAVENOUS

## 2013-08-21 MED ORDER — ONDANSETRON HCL 4 MG PO TABS: 4.0000 mg | ORAL_TABLET | Freq: Four times a day (QID) | ORAL | Status: DC

## 2013-08-21 NOTE — ED Notes (Signed)
Per pt. Daughter, pt. C/o abdominal pain, back pain, and painful urination x 1 week. Pt c/o Nausesa. Denise vomiting or diarrhea. Pt c/o fevers. A&Ox4.

## 2013-08-21 NOTE — ED Provider Notes (Signed)
CSN: [id number]     Arrival date & time 08/21/13  1534 History   First MD Initiated Contact with Patient 08/21/13 1544     No chief complaint on file.  (Consider location/radiation/quality/duration/timing/severity/associated sxs/prior Treatment) Patient is a 32 y.o. female presenting with dysuria. The history is provided by the patient. A language interpreter was used.  Dysuria Pain quality:  Aching and burning Pain severity:  Moderate Onset quality:  Gradual Duration:  1 week Progression:  Waxing and waning Recent urinary tract infections: no   Urinary symptoms: hematuria   Associated symptoms: abdominal pain, fever, flank pain and nausea   Associated symptoms: no vaginal discharge and no vomiting   Abdominal pain:    Location:  LUQ and RUQ   Quality:  Aching   Severity:  Moderate Fever:    Progression:  Waxing and waning Nausea:    Severity:  Mild Risk factors: hx of pyelonephritis   Risk factors: no recurrent urinary tract infections   Pt is a 32 year old, febrile spanish-speaking female. Interpreter used via phone with nurse at bedside. Pt reports that she has had dysuria for approx 1 week. She reports that she initially had hematuria the first couple days. She has had worsening of symptom over the last few days with fever that waxes and wanes. She has tenderness in her upper abdomen that radiates around to her back. She reports left CVA tenderness but denies pain on the right. She denies vomiting or diarrhea but has had fever and nausea. She reports that she has a history of asthma and recently had a flare-up when she had some cold symptoms back in November. She currently is not using her albuterol and denies any shortness of breath, chest pain or difficulty breathing. She reports normal periods and LMP was 07/27/2013.     Past Medical History  Diagnosis Date  . Asthma    No past surgical history on file. No family history on file. History  Substance Use Topics  . Smoking  status: Never Smoker   . Smokeless tobacco: Never Used  . Alcohol Use: No   OB History   Grav Para Term Preterm Abortions TAB SAB Ect Mult Living                 Review of Systems  Constitutional: Positive for fever.  Gastrointestinal: Positive for nausea and abdominal pain. Negative for vomiting.  Genitourinary: Positive for dysuria and flank pain. Negative for vaginal discharge.    Allergies  Review of patient's allergies indicates no known allergies.  Home Medications  No current outpatient prescriptions on file. BP 120/73  Pulse 142  Temp(Src) 101.3 F (38.5 C) (Oral)  Resp 16  SpO2 96% Physical Exam  Nursing note and vitals reviewed. Constitutional: She is oriented to person, place, and time. She appears well-developed and well-nourished. No distress.  HENT:  Head: Normocephalic and atraumatic.  Eyes: Conjunctivae and EOM are normal.  Neck: Normal range of motion. Neck supple. No JVD present. No tracheal deviation present. No thyromegaly present.  Cardiovascular: Regular rhythm and normal heart sounds.  Tachycardia present.   Pulmonary/Chest: Effort normal and breath sounds normal. No respiratory distress. She has no wheezes.  Abdominal: Soft. Normal appearance and bowel sounds are normal. There is tenderness. There is CVA tenderness. There is no rebound and no guarding.  Musculoskeletal: Normal range of motion.  Lymphadenopathy:    She has no cervical adenopathy.  Neurological: She is alert and oriented to person, place, and time.  Skin: Skin is warm and dry. No rash noted. There is erythema. No pallor.  Cheeks flushed.  Psychiatric: She has a normal mood and affect. Her behavior is normal. Judgment and thought content normal.    ED Course  Procedures (including critical care time) Labs Review Labs Reviewed - No data to display Imaging Review No results found.  EKG Interpretation   None     9:52 PM BP 97/44  Pulse 103  Temp(Src) 98.7 F (37.1 C)  (Oral)  Resp 20  SpO2 100%  LMP 07/27/2013  Pt feeling better after IV fluids, Rocephin IV and pain meds. She is non-toxic in appearance and would like to go home. She denies nausea at this time. She is afebrile now and ambulating without difficulty. We discussed plan of treatment via language interpreter and she understands.  MDM   1. Pyelonephritis     Mild leukocytosis, + CVA tenderness, Fever decreased after IV fluids and acetaminophen here in ER. Rocephin given IV. Pt wishes to go home, VS stable, non-toxic. Prescriptions for Cephalexin, Zofran and Norco given. Strict follow-up precautions discussed. Will return if an worsening of symptoms, increased fever, back pain or abdominal pain. Counseled via interpreter line and pt agrees to plan of care.     Irish Elders, NP 09/01/13 1101

## 2013-08-23 LAB — URINE CULTURE

## 2013-08-24 ENCOUNTER — Telehealth (HOSPITAL_COMMUNITY): Payer: Self-pay | Admitting: Emergency Medicine

## 2013-08-24 NOTE — ED Notes (Signed)
Post ED Visit - Positive Culture Follow-up  Culture report reviewed by antimicrobial stewardship pharmacist: []  Wes Dulaney, Pharm.D., BCPS []  Celedonio Miyamoto, Pharm.D., BCPS []  Georgina Pillion, Pharm.D., BCPS []  [place], [place].D., BCPS, AAHIVP [x]  Estella Husk, Pharm.D., BCPS, AAHIVP  Positive urine culture Treated with Keflex, organism sensitive to the same and no further patient follow-up is required at this time.  Zeb Comfort 08/24/2013, 12:18 PM

## 2013-09-01 NOTE — ED Provider Notes (Signed)
Medical screening examination/treatment/procedure(s) were performed by non-physician practitioner and as supervising physician I was immediately available for consultation/collaboration.  EKG Interpretation   None        Courtney F Horton, MD 09/01/13 1655 

## 2017-11-22 ENCOUNTER — Emergency Department (HOSPITAL_COMMUNITY): Payer: No Typology Code available for payment source

## 2017-11-22 ENCOUNTER — Emergency Department (HOSPITAL_COMMUNITY)
Admission: EM | Admit: 2017-11-22 | Discharge: 2017-11-22 | Disposition: A | Discharge status: Home / Self Care | Payer: No Typology Code available for payment source | Attending: Emergency Medicine | Admitting: Emergency Medicine

## 2017-11-22 ENCOUNTER — Encounter (HOSPITAL_COMMUNITY): Payer: Self-pay

## 2017-11-22 ENCOUNTER — Other Ambulatory Visit: Payer: Self-pay

## 2017-11-22 DIAGNOSIS — Y9389 Activity, other specified: Secondary | ICD-10-CM | POA: Diagnosis not present

## 2017-11-22 DIAGNOSIS — M25512 Pain in left shoulder: Secondary | ICD-10-CM | POA: Insufficient documentation

## 2017-11-22 DIAGNOSIS — R51 Headache: Secondary | ICD-10-CM | POA: Diagnosis not present

## 2017-11-22 DIAGNOSIS — Z79899 Other long term (current) drug therapy: Secondary | ICD-10-CM | POA: Insufficient documentation

## 2017-11-22 DIAGNOSIS — Y999 Unspecified external cause status: Secondary | ICD-10-CM | POA: Insufficient documentation

## 2017-11-22 DIAGNOSIS — J45909 Unspecified asthma, uncomplicated: Secondary | ICD-10-CM | POA: Diagnosis not present

## 2017-11-22 DIAGNOSIS — M542 Cervicalgia: Secondary | ICD-10-CM | POA: Diagnosis not present

## 2017-11-22 DIAGNOSIS — Y92410 Unspecified street and highway as the place of occurrence of the external cause: Secondary | ICD-10-CM | POA: Diagnosis not present

## 2017-11-22 DIAGNOSIS — M545 Low back pain: Secondary | ICD-10-CM | POA: Diagnosis not present

## 2017-11-22 DIAGNOSIS — R200 Anesthesia of skin: Secondary | ICD-10-CM | POA: Insufficient documentation

## 2017-11-22 LAB — BASIC METABOLIC PANEL
ANION GAP: 8 (ref 5–15)
BUN: 11 mg/dL (ref 6–20)
CHLORIDE: 104 mmol/L (ref 101–111)
CO2: 25 mmol/L (ref 22–32)
CREATININE: 0.59 mg/dL (ref 0.44–1.00)
Calcium: 9.4 mg/dL (ref 8.9–10.3)
GFR calc Af Amer: >60 mL/min (ref >60)
GFR calc non Af Amer: >60 mL/min (ref >60)
Glucose, Bld: 93 mg/dL (ref 65–99)
POTASSIUM: 3.5 mmol/L (ref 3.5–5.1)
SODIUM: 137 mmol/L (ref 135–145)

## 2017-11-22 LAB — CBC
HEMATOCRIT: 43.7 % (ref 36.0–46.0)
Hemoglobin: 15.1 g/dL — ABNORMAL HIGH (ref 12.0–15.0)
MCH: 30.3 pg (ref 26.0–34.0)
MCHC: 34.6 g/dL (ref 30.0–36.0)
MCV: 87.8 fL (ref 78.0–100.0)
PLATELETS: 346 10*3/uL (ref 150–400)
RBC: 4.98 MIL/uL (ref 3.87–5.11)
RDW: 13.3 % (ref 11.5–15.5)
WBC: 10.2 10*3/uL (ref 4.0–10.5)

## 2017-11-22 LAB — POC URINE PREG, ED: Preg Test, Ur: NEGATIVE

## 2017-11-22 MED ORDER — IBUPROFEN 600 MG PO TABS: 600.0000 mg | ORAL_TABLET | Freq: Four times a day (QID) | ORAL | 0 refills | Status: DC | PRN

## 2017-11-22 MED ORDER — ACETAMINOPHEN 500 MG PO TABS
1000.0000 mg | ORAL_TABLET | Freq: Once | ORAL | Status: AC
  Administered 2017-11-22: 1000 mg via ORAL
  Filled 2017-11-22: qty 2

## 2017-11-22 MED ORDER — IBUPROFEN 200 MG PO TABS
600.0000 mg | ORAL_TABLET | Freq: Once | ORAL | Status: AC
  Administered 2017-11-22: 600 mg via ORAL
  Filled 2017-11-22: qty 3

## 2017-11-22 MED ORDER — PREDNISONE 20 MG PO TABS: 40.0000 mg | ORAL_TABLET | Freq: Every day | ORAL | 0 refills | Status: AC

## 2017-11-22 MED ORDER — METHOCARBAMOL 500 MG PO TABS: 500.0000 mg | ORAL_TABLET | Freq: Two times a day (BID) | ORAL | 0 refills | Status: DC
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
## 2017-11-22 NOTE — ED Notes (Signed)
Pt is alert and oriented x 4 and is verbally responsive. Pt reports that she has back pain and left shoulder pain.

## 2017-11-23 ENCOUNTER — Telehealth: Payer: Self-pay | Admitting: General Practice

## 2017-11-23 NOTE — Care Management Note (Signed)
Case Management Note  CM consulted for follow up needs with no pcp and no ins.  CM noted pt was given all 3 clinics information and that [place].  Pt can call the clinics to make an appointment on her own.  [facility] for possible appointment cancellation openings.  No further CM needs noted at this time.

## 2017-11-23 NOTE — Telephone Encounter (Signed)
Laural BenesJane B, [facility] case manager, received message from Karna ChristmasAngela K, hospital case manager, regarding scheduling a hospital follow up appointment for [facility] but at the moment there are no appointments available.   Sent Marylene Landngela K a message informing her of this.

## 2017-12-07 ENCOUNTER — Telehealth: Payer: Self-pay | Admitting: General Practice

## 2017-12-07 NOTE — Telephone Encounter (Signed)
Call placed to patient #[phone], regarding a hospital follow up appointment. Spoke with patient (in Spanish) and informed her who I was and our office's services. Patient was appreciative but stated that she cannot schedule any appointments at the moment due to lack of transportation. Patient stated that she will call us when she is ready.   Update message sent to Karna ChristmasAngela K, case manager.

## 2018-07-29 ENCOUNTER — Other Ambulatory Visit: Payer: Self-pay

## 2018-07-29 ENCOUNTER — Encounter (HOSPITAL_COMMUNITY): Payer: Self-pay

## 2018-07-29 ENCOUNTER — Emergency Department (HOSPITAL_COMMUNITY)
Admission: EM | Admit: 2018-07-29 | Discharge: 2018-07-29 | Disposition: A | Discharge status: Home / Self Care | Payer: Self-pay | Attending: Emergency Medicine | Admitting: Emergency Medicine

## 2018-07-29 ENCOUNTER — Emergency Department (HOSPITAL_COMMUNITY): Payer: Self-pay

## 2018-07-29 DIAGNOSIS — M545 Low back pain, unspecified: Secondary | ICD-10-CM

## 2018-07-29 DIAGNOSIS — M25511 Pain in right shoulder: Secondary | ICD-10-CM | POA: Insufficient documentation

## 2018-07-29 DIAGNOSIS — Z79899 Other long term (current) drug therapy: Secondary | ICD-10-CM | POA: Insufficient documentation

## 2018-07-29 DIAGNOSIS — R1031 Right lower quadrant pain: Secondary | ICD-10-CM | POA: Insufficient documentation

## 2018-07-29 DIAGNOSIS — J45909 Unspecified asthma, uncomplicated: Secondary | ICD-10-CM | POA: Insufficient documentation

## 2018-07-29 DIAGNOSIS — N898 Other specified noninflammatory disorders of vagina: Secondary | ICD-10-CM | POA: Insufficient documentation

## 2018-07-29 LAB — URINALYSIS, ROUTINE W REFLEX MICROSCOPIC
Bilirubin Urine: NEGATIVE
Glucose, UA: NEGATIVE mg/dL
Hgb urine dipstick: NEGATIVE
Ketones, ur: NEGATIVE mg/dL
Leukocytes, UA: NEGATIVE
Nitrite: NEGATIVE
Protein, ur: NEGATIVE mg/dL
Specific Gravity, Urine: 1.016 (ref 1.005–1.030)
pH: 7 (ref 5.0–8.0)

## 2018-07-29 LAB — CBC
HCT: 46.8 % — ABNORMAL HIGH (ref 36.0–46.0)
Hemoglobin: 15.3 g/dL — ABNORMAL HIGH (ref 12.0–15.0)
MCH: 28.7 pg (ref 26.0–34.0)
MCHC: 32.7 g/dL (ref 30.0–36.0)
MCV: 87.6 fL (ref 80.0–100.0)
Platelets: 345 10*3/uL (ref 150–400)
RBC: 5.34 MIL/uL — ABNORMAL HIGH (ref 3.87–5.11)
RDW: 13.4 % (ref 11.5–15.5)
WBC: 7.1 10*3/uL (ref 4.0–10.5)
nRBC: 0 % (ref 0.0–0.2)

## 2018-07-29 LAB — COMPREHENSIVE METABOLIC PANEL
ALT: 29 U/L (ref 0–44)
AST: 20 U/L (ref 15–41)
Albumin: 4.3 g/dL (ref 3.5–5.0)
Alkaline Phosphatase: 73 U/L (ref 38–126)
Anion gap: 9 (ref 5–15)
BUN: 10 mg/dL (ref 6–20)
CO2: 25 mmol/L (ref 22–32)
Calcium: 8.9 mg/dL (ref 8.9–10.3)
Chloride: 103 mmol/L (ref 98–111)
Creatinine, Ser: 0.4 mg/dL — ABNORMAL LOW (ref 0.44–1.00)
GFR calc Af Amer: >60 mL/min (ref >60)
GFR calc non Af Amer: >60 mL/min (ref >60)
Glucose, Bld: 92 mg/dL (ref 70–99)
Potassium: 3.9 mmol/L (ref 3.5–5.1)
Sodium: 137 mmol/L (ref 135–145)
Total Bilirubin: 0.6 mg/dL (ref 0.3–1.2)
Total Protein: 7.9 g/dL (ref 6.5–8.1)

## 2018-07-29 LAB — WET PREP, GENITAL
Sperm: NONE SEEN
Trich, Wet Prep: NONE SEEN
Yeast Wet Prep HPF POC: NONE SEEN

## 2018-07-29 LAB — LIPASE, BLOOD: Lipase: 36 U/L (ref 11–51)

## 2018-07-29 LAB — I-STAT BETA HCG BLOOD, ED (MC, WL, AP ONLY): I-stat hCG, quantitative: <5 m[IU]/mL (ref <5)

## 2018-07-29 MED ORDER — KETOROLAC TROMETHAMINE 15 MG/ML IJ SOLN: 15.0000 mg | Freq: Once | INTRAMUSCULAR | Status: DC

## 2018-07-29 MED ORDER — TRAMADOL HCL 50 MG PO TABS: 50.0000 mg | ORAL_TABLET | Freq: Two times a day (BID) | ORAL | 0 refills | Status: DC | PRN

## 2018-07-29 MED ORDER — HYDROMORPHONE HCL 1 MG/ML IJ SOLN: 0.5000 mg | Freq: Once | INTRAMUSCULAR | Status: DC

## 2018-07-29 MED ORDER — SODIUM CHLORIDE 0.9 % IV BOLUS
1000.0000 mL | Freq: Once | INTRAVENOUS | Status: AC
  Administered 2018-07-29: 1000 mL via INTRAVENOUS

## 2018-07-29 MED ORDER — KETOROLAC TROMETHAMINE 15 MG/ML IJ SOLN
15.0000 mg | Freq: Once | INTRAMUSCULAR | Status: AC
  Administered 2018-07-29: 15 mg via INTRAVENOUS
  Filled 2018-07-29: qty 1

## 2018-07-29 MED ORDER — METRONIDAZOLE 500 MG PO TABS: 500.0000 mg | ORAL_TABLET | Freq: Two times a day (BID) | ORAL | 0 refills | Status: DC

## 2018-07-29 MED ORDER — MORPHINE SULFATE (PF) 4 MG/ML IV SOLN
4.0000 mg | Freq: Once | INTRAVENOUS | Status: AC
  Administered 2018-07-29: 4 mg via INTRAVENOUS
  Filled 2018-07-29: qty 1

## 2018-07-29 NOTE — ED Provider Notes (Signed)
[facility]-EMERGENCY DEPT Provider Note   CSN: [id number] Arrival date & time: 07/29/18  1028     History   Chief Complaint Chief Complaint  Patient presents with  . Back Pain  . UTI symptoms  . Vaginal Discharge  . Abdominal Pain    HPI Debra Hurley is a 37 y.o. female.  HPI   37 year old female with several complaints.  She is primarily complaining of vaginal irritation, discharge some pain.  Has been ongoing for several weeks.  She says she was she was seen by someone in [place] similar complaints and placed on antibiotics without improvement.  She is not sure what her specific diagnosis or what antibiotic she was on though.    She has also been having pain in her bilateral lower back which sometimes radiates into both legs.  Pain in her right shoulder pain in the right upper quadrant.  His pain is also been ongoing for about 3 weeks.  Occasional nausea.  No vomiting.  No specific urinary complaints.  No fevers or chills.  Past Medical History:  Diagnosis Date  . Asthma     Patient Active Problem List   Diagnosis Date Noted  . Pyelonephritis 08/19/2011  . Tachycardia 08/19/2011  . Asthma 08/19/2011    History reviewed. No pertinent surgical history.   OB History   None      Home Medications    Prior to Admission medications   Medication Sig Start Date End Date Taking? Authorizing Provider  albuterol (PROVENTIL HFA;VENTOLIN HFA) 108 (90 BASE) MCG/ACT inhaler Inhale 2 puffs into the lungs every 6 (six) hours as needed for wheezing or shortness of breath.   Yes [provider]  ranitidine (ZANTAC) 150 MG tablet Take 150 mg by mouth daily as needed for heartburn.    Yes [provider]  cephALEXin (KEFLEX) 500 MG capsule Take 1 capsule (500 mg total) by mouth 4 (four) times daily. Patient not taking: Reported on 07/29/2018 08/21/13   Irish EldersWalker, Kelly, FNP  ibuprofen (ADVIL,MOTRIN) 600 MG tablet Take 1 tablet (600 mg  total) by mouth every 6 (six) hours as needed. Patient not taking: Reported on 07/29/2018 11/22/17   Aviva KluverMurray, Alyssa B, PA-C  methocarbamol (ROBAXIN) 500 MG tablet Take 1 tablet (500 mg total) by mouth 2 (two) times daily. Patient not taking: Reported on 07/29/2018 11/22/17   Aviva KluverMurray, Alyssa B, PA-C  ondansetron (ZOFRAN) 4 MG tablet Take 1 tablet (4 mg total) by mouth every 6 (six) hours. Patient not taking: Reported on 07/29/2018 08/21/13   Irish EldersWalker, Kelly, FNP    Family History History reviewed. No pertinent family history.  Social History Social History   Tobacco Use  . Smoking status: Never Smoker  . Smokeless tobacco: Never Used  Substance Use Topics  . Alcohol use: No  . Drug use: No     Allergies   Patient has no known allergies.   Review of Systems Review of Systems  All systems reviewed and negative, other than as noted in HPI.  Physical Exam Updated Vital Signs BP (!) 145/98 (BP Location: Left Arm)   Pulse (!) 102   Temp 98.7 F (37.1 C) (Oral)   Resp 16   Ht 4\' 9"  (1.448 m)   Wt 68 kg   LMP 10/28/2017   SpO2 100%   BMI 32.46 kg/m   Physical Exam  Constitutional: She appears well-developed and well-nourished. No distress.  HENT:  Head: Normocephalic and atraumatic.  Eyes: Conjunctivae are normal. Right eye  exhibits no discharge. Left eye exhibits no discharge.  Neck: Neck supple.  Cardiovascular: Normal rate, regular rhythm and normal heart sounds. Exam reveals no gallop and no friction rub.  No murmur heard. Pulmonary/Chest: Effort normal and breath sounds normal. No respiratory distress.  Abdominal: Soft. She exhibits no distension. There is tenderness.  Mild tenderness to palpation right upper quadrant without rebound or guarding.  No midline spinal tenderness.  Full range of motion of the right shoulder actively without apparent discomfort.  Strength is normal bilateral lower extremities.  Genitourinary:  Genitourinary Comments: Chaperone present.   Normal external female genitalia.  No concerning lesions noted.  Thick white-gray vaginal discharge.  Cervix normal in appearance.  Musculoskeletal: She exhibits no edema or tenderness.  Neurological: She is alert.  Skin: Skin is warm and dry.  Psychiatric: She has a normal mood and affect. Her behavior is normal. Thought content normal.  Nursing note and vitals reviewed.    ED Treatments / Results  Labs (all labs ordered are listed, but only abnormal results are displayed) Labs Reviewed  WET PREP, GENITAL - Abnormal; Notable for the following components:      Result Value   Clue Cells Wet Prep HPF POC PRESENT (*)    WBC, Wet Prep HPF POC FEW (*)    All other components within normal limits  COMPREHENSIVE METABOLIC PANEL - Abnormal; Notable for the following components:   Creatinine, Ser 0.40 (*)    All other components within normal limits  CBC - Abnormal; Notable for the following components:   RBC 5.34 (*)    Hemoglobin 15.3 (*)    HCT 46.8 (*)    All other components within normal limits  URINALYSIS, ROUTINE W REFLEX MICROSCOPIC - Abnormal; Notable for the following components:   APPearance HAZY (*)    All other components within normal limits  LIPASE, BLOOD  I-STAT BETA HCG BLOOD, ED ([facility], [facility], [facility] ONLY)  GC/CHLAMYDIA PROBE AMP ([place]) NOT AT [facility]    EKG None  Radiology Dg Chest 2 View  Result Date: 07/29/2018 CLINICAL DATA:  Upper back pain radiating into arms and lower back. EXAM: CHEST - 2 VIEW COMPARISON:  December 03, 2009 FINDINGS: The heart size and mediastinal contours are within normal limits. Both lungs are clear. The visualized skeletal structures are unremarkable. IMPRESSION: No active cardiopulmonary disease. Electronically Signed   By: Gerome Sam III M.D   On: 07/29/2018 13:25   US Abdomen Limited Ruq  Result Date: 07/29/2018 CLINICAL DATA:  Right upper quadrant pain for a few months. EXAM: ULTRASOUND ABDOMEN LIMITED RIGHT UPPER QUADRANT COMPARISON:   CT abdomen and pelvis 01/06/2010 FINDINGS: Gallbladder: No gallstones or wall thickening visualized. No sonographic Murphy sign noted by sonographer. Common bile duct: Diameter: 4 mm Liver: Diffusely increased parenchymal echogenicity without focal lesion identified. Portal vein is patent on color Doppler imaging with normal direction of blood flow towards the liver. IMPRESSION: 1. No gallstones or biliary dilatation. 2. Echogenic liver, nonspecific though can be seen with steatosis, chronic hepatitis, and infiltrative conditions. Electronically Signed   By: Sebastian Ache M.D.   On: 07/29/2018 13:44    Procedures Procedures (including critical care time)  Medications Ordered in ED Medications  morphine 4 MG/ML injection 4 mg (4 mg Intravenous Given 07/29/18 1226)  ketorolac (TORADOL) 15 MG/ML injection 15 mg (15 mg Intravenous Given 07/29/18 1225)  sodium chloride 0.9 % bolus 1,000 mL (0 mLs Intravenous Stopped 07/29/18 1341)     Initial Impression /  Assessment and Plan / ED Course  I have reviewed the triage vital signs and the nursing notes.  Pertinent labs & imaging results that were available during my care of the patient were reviewed by me and considered in my medical decision making (see chart for details).     37 year old female with multiple complaints.  Work-up significant for bacterial vaginosis.  Will place her on Flagyl.  Also right upper quadrant pain with a normal ultrasound and LFTs. R shoulder and lower back pain for weeks w/o red flags. Symptomatic tx. Needs to establish with PCP.  Final Clinical Impressions(s) / ED Diagnoses   Final diagnoses:  RLQ abdominal pain  Vaginal discharge  Acute midline low back pain without sciatica  Right shoulder pain, unspecified chronicity    ED Discharge Orders    None       Raeford Razor, MD 07/29/18 1526

## 2018-07-29 NOTE — ED Triage Notes (Addendum)
Patient c/o upper back pain that radiates into both arms and bilateral lower back pain that radiates down both legs x 4 weeks ago.  Patient also c/o foul smelling urine x 3 weeks. Patient states she saw a provider in [place] and was prescribed antibiotics, but she is still having symptoms  Patient states she has had vaginal discharge white and foul smelling x 6 months. Patient c/o right lower abdominal pain x 4 weeks.

## 2018-07-30 LAB — GC/CHLAMYDIA PROBE AMP (~~LOC~~) NOT AT ARMC
Chlamydia: NEGATIVE
Neisseria Gonorrhea: NEGATIVE

## 2018-11-16 ENCOUNTER — Encounter (HOSPITAL_COMMUNITY): Payer: Self-pay

## 2018-11-16 ENCOUNTER — Emergency Department (HOSPITAL_COMMUNITY)
Admission: EM | Admit: 2018-11-16 | Discharge: 2018-11-16 | Disposition: A | Discharge status: Home / Self Care | Payer: Self-pay | Attending: Emergency Medicine | Admitting: Emergency Medicine

## 2018-11-16 ENCOUNTER — Emergency Department (HOSPITAL_COMMUNITY): Payer: Self-pay

## 2018-11-16 ENCOUNTER — Other Ambulatory Visit: Payer: Self-pay

## 2018-11-16 DIAGNOSIS — J45909 Unspecified asthma, uncomplicated: Secondary | ICD-10-CM | POA: Insufficient documentation

## 2018-11-16 DIAGNOSIS — Z79899 Other long term (current) drug therapy: Secondary | ICD-10-CM | POA: Insufficient documentation

## 2018-11-16 DIAGNOSIS — M79672 Pain in left foot: Secondary | ICD-10-CM | POA: Insufficient documentation

## 2018-11-16 MED ORDER — PREDNISONE 10 MG PO TABS: 40.0000 mg | ORAL_TABLET | Freq: Every day | ORAL | 0 refills | Status: DC

## 2018-11-16 NOTE — ED Notes (Signed)
Patient transported to X-ray 

## 2018-11-16 NOTE — Discharge Instructions (Signed)
You may take tylenol and ibuprofen in addition to the medications we give you. Follow up with [facility].

## 2018-11-16 NOTE — ED Provider Notes (Signed)
[facility] EMERGENCY DEPARTMENT Provider Note   CSN: [id number] Arrival date & time: 11/16/18  2027    History   Chief Complaint Chief Complaint  Patient presents with  . Foot Pain    HPI Debra Hurley is a 38 y.o. female who presents to the ED with c/o foot pain. The pain is located in the left foot and started 3 weeks ago. Patient can not remember any injury to the area. The pain is mostly in the left great toe.      The history is provided by the patient. A language interpreter was used.    Past Medical History:  Diagnosis Date  . Asthma     Patient Active Problem List   Diagnosis Date Noted  . Pyelonephritis 08/19/2011  . Tachycardia 08/19/2011  . Asthma 08/19/2011    History reviewed. No pertinent surgical history.   OB History   No obstetric history on file.      Home Medications    Prior to Admission medications   Medication Sig Start Date End Date Taking? Authorizing Provider  albuterol (PROVENTIL HFA;VENTOLIN HFA) 108 (90 BASE) MCG/ACT inhaler Inhale 2 puffs into the lungs every 6 (six) hours as needed for wheezing or shortness of breath.    [provider]  ibuprofen (ADVIL,MOTRIN) 600 MG tablet Take 1 tablet (600 mg total) by mouth every 6 (six) hours as needed. 11/22/17   Aviva Kluver B, PA-C  methocarbamol (ROBAXIN) 500 MG tablet Take 1 tablet (500 mg total) by mouth 2 (two) times daily. 11/22/17   Aviva Kluver B, PA-C  metroNIDAZOLE (FLAGYL) 500 MG tablet Take 1 tablet (500 mg total) by mouth 2 (two) times daily. 07/29/18   Raeford Razor, MD  ondansetron (ZOFRAN) 4 MG tablet Take 1 tablet (4 mg total) by mouth every 6 (six) hours. 08/21/13   Irish Elders, FNP  predniSONE (DELTASONE) 10 MG tablet Take 4 tablets (40 mg total) by mouth daily with breakfast. 11/16/18   Janne Napoleon, NP  ranitidine (ZANTAC) 150 MG tablet Take 150 mg by mouth daily as needed for heartburn.     [provider]  traMADol  (ULTRAM) 50 MG tablet Take 1 tablet (50 mg total) by mouth every 12 (twelve) hours as needed. 07/29/18   Raeford Razor, MD    Family History History reviewed. No pertinent family history.  Social History Social History   Tobacco Use  . Smoking status: Never Smoker  . Smokeless tobacco: Never Used  Substance Use Topics  . Alcohol use: No  . Drug use: No     Allergies   Patient has no known allergies.   Review of Systems Review of Systems  Musculoskeletal: Positive for arthralgias.       Left foot pain  All other systems reviewed and are negative.    Physical Exam Updated Vital Signs BP 119/79 (BP Location: Left Arm)   Pulse (!) 113   Temp 97.6 F (36.4 C) (Oral)   Resp 18   LMP 11/15/2017 (Exact Date)   SpO2 100%   Physical Exam Vitals signs and nursing note reviewed.  Constitutional:      Appearance: She is well-developed.  HENT:     Head: Normocephalic.     Mouth/Throat:     Mouth: Mucous membranes are moist.  Eyes:     Conjunctiva/sclera: Conjunctivae normal.  Neck:     Musculoskeletal: Neck supple.  Cardiovascular:     Rate and Rhythm: Tachycardia present.  Pulses: Normal pulses.  Pulmonary:     Effort: Pulmonary effort is normal.  Musculoskeletal:     Left foot: Normal range of motion and normal capillary refill. Tenderness present. No deformity or laceration. Swelling: minimal left great toe.     Comments: Pedal pulse 2+  Skin:    General: Skin is warm and dry.  Neurological:     Mental Status: She is alert and oriented to person, place, and time.      ED Treatments / Results  Labs (all labs ordered are listed, but only abnormal results are displayed) Labs Reviewed - No data to display  Radiology Dg Foot Complete Left  Result Date: 11/16/2018 CLINICAL DATA:  Left foot pain for 1 month. EXAM: LEFT FOOT - COMPLETE 3+ VIEW COMPARISON:  None. FINDINGS: There is no evidence of fracture or dislocation. There is no evidence of arthropathy  or other focal bone abnormality. Soft tissues are unremarkable. IMPRESSION: Negative. Electronically Signed   By: Ted Mcalpine M.D.   On: 11/16/2018 21:29    Procedures Procedures (including critical care time)  Medications Ordered in ED Medications - No data to display   Initial Impression / Assessment and Plan / ED Course  I have reviewed the triage vital signs and the nursing notes. 38 y.o. female here with left foot pain that is mainly in the left great toe stable for d/c. No acute findings on x-ray and no focal neuro deficits. Will treat for inflammation and discussed possible gout and f/u with [facility] wellness. Patient agrees with plan.   Final Clinical Impressions(s) / ED Diagnoses   Final diagnoses:  Foot pain, left    ED Discharge Orders         Ordered    predniSONE (DELTASONE) 10 MG tablet  Daily with breakfast     11/16/18 2151           Kerrie Buffalo [place], [place] 11/16/18 2156    Terrilee Files, MD 11/16/18 2237

## 2018-11-16 NOTE — ED Triage Notes (Signed)
Pt here for left foot pain for the last 3 weeks.  No injury or falls.  No surgeries to that leg.  No swelling noted on exam.  A&Ox4.  Spanish interpreter used in triage.

## 2018-11-16 NOTE — ED Notes (Signed)
Patient Alert and oriented to baseline. Stable and ambulatory to baseline. Patient verbalized understanding of the discharge instructions.  Patient belongings were taken by the patient.   

## 2022-10-26 ENCOUNTER — Other Ambulatory Visit: Payer: Self-pay

## 2022-10-26 ENCOUNTER — Emergency Department (HOSPITAL_COMMUNITY)
Admission: EM | Admit: 2022-10-26 | Discharge: 2022-10-27 | Disposition: A | Discharge status: Home / Self Care | Payer: Self-pay | Attending: Emergency Medicine | Admitting: Emergency Medicine

## 2022-10-26 DIAGNOSIS — R000 Tachycardia, unspecified: Secondary | ICD-10-CM | POA: Insufficient documentation

## 2022-10-26 DIAGNOSIS — Z7951 Long term (current) use of inhaled steroids: Secondary | ICD-10-CM | POA: Insufficient documentation

## 2022-10-26 DIAGNOSIS — J4541 Moderate persistent asthma with (acute) exacerbation: Secondary | ICD-10-CM | POA: Insufficient documentation

## 2022-10-26 MED ORDER — IPRATROPIUM-ALBUTEROL 0.5-2.5 (3) MG/3ML IN SOLN
3.0000 mL | Freq: Once | RESPIRATORY_TRACT | Status: AC
  Administered 2022-10-26: 3 mL via RESPIRATORY_TRACT
  Filled 2022-10-26: qty 3

## 2022-10-26 MED ORDER — PREDNISONE 20 MG PO TABS
60.0000 mg | ORAL_TABLET | Freq: Once | ORAL | Status: AC
  Administered 2022-10-26: 60 mg via ORAL
  Filled 2022-10-26: qty 3

## 2022-10-26 NOTE — ED Triage Notes (Signed)
Patient reports SOB with productive cough and wheezing onset yesterday unrelieved by rescue inhaler , no fever or chills .

## 2022-10-26 NOTE — ED Provider Triage Note (Signed)
Emergency Medicine Provider Triage Evaluation Note  Debra Hurley , a 42 y.o. female  was evaluated in triage.  Pt complains of asthma exacerbation onset last night. No prior hospitalizations related to asthma. Using albuterol inhaler without relief. No fevers. Interpreter used for evaluation  Review of Systems  Positive: As above Negative: As above  Physical Exam  BP 128/88   Pulse (!) 126   Temp 98.2 F (36.8 C) (Oral)   Resp (!) 30   LMP 11/15/2017 (Exact Date)   SpO2 93%  Gen:   Awake, tachypneic, audible wheezing Resp:  As above MSK:   Moves extremities without difficulty  Other:    Medical Decision Making  Medically screening exam initiated at 10:09 PM.  Appropriate orders placed.  Rosemaria Galvez-Cortes was informed that the remainder of the evaluation will be completed by another provider, this initial triage assessment does not replace that evaluation, and the importance of remaining in the ED until their evaluation is complete.     Tacy Learn, PA-C 10/26/22 2210

## 2022-10-27 MED ORDER — IPRATROPIUM-ALBUTEROL 0.5-2.5 (3) MG/3ML IN SOLN
3.0000 mL | Freq: Once | RESPIRATORY_TRACT | Status: AC
  Administered 2022-10-27: 3 mL via RESPIRATORY_TRACT
  Filled 2022-10-27: qty 3

## 2022-10-27 MED ORDER — ALBUTEROL SULFATE (2.5 MG/3ML) 0.083% IN NEBU: 2.5000 mg | INHALATION_SOLUTION | Freq: Four times a day (QID) | RESPIRATORY_TRACT | 12 refills | Status: AC | PRN

## 2022-10-27 MED ORDER — ALBUTEROL (5 MG/ML) CONTINUOUS INHALATION SOLN: 5.0000 mg/h | INHALATION_SOLUTION | Freq: Once | RESPIRATORY_TRACT | Status: DC

## 2022-10-27 MED ORDER — IPRATROPIUM-ALBUTEROL 0.5-2.5 (3) MG/3ML IN SOLN: 3.0000 mL | Freq: Four times a day (QID) | RESPIRATORY_TRACT | 0 refills | Status: AC | PRN

## 2022-10-27 MED ORDER — ALBUTEROL SULFATE HFA 108 (90 BASE) MCG/ACT IN AERS: 1.0000 | INHALATION_SPRAY | Freq: Four times a day (QID) | RESPIRATORY_TRACT | 6 refills | Status: AC | PRN

## 2022-10-27 MED ORDER — PREDNISONE 50 MG PO TABS: 50.0000 mg | ORAL_TABLET | Freq: Every day | ORAL | 0 refills | Status: AC

## 2022-10-27 MED ORDER — ACETAMINOPHEN 325 MG PO TABS
650.0000 mg | ORAL_TABLET | Freq: Once | ORAL | Status: AC
  Administered 2022-10-27: 650 mg via ORAL
  Filled 2022-10-27: qty 2

## 2022-10-27 MED ORDER — MAGNESIUM SULFATE 2 GM/50ML IV SOLN
2.0000 g | Freq: Once | INTRAVENOUS | Status: AC
  Administered 2022-10-27: 2 g via INTRAVENOUS
  Filled 2022-10-27: qty 50

## 2022-10-27 MED ORDER — ALBUTEROL SULFATE (2.5 MG/3ML) 0.083% IN NEBU
10.0000 mg/h | INHALATION_SOLUTION | Freq: Once | RESPIRATORY_TRACT | Status: AC
  Administered 2022-10-27: 10 mg/h via RESPIRATORY_TRACT
  Filled 2022-10-27: qty 3

## 2022-10-27 NOTE — ED Provider Notes (Signed)
[facility] Provider Note   CSN: [id number] Arrival date & time: 10/26/22  2153     History  Chief Complaint  Patient presents with   Asthma    Debra Hurley is a 42 y.o. female.  42 year old female with past medical history of asthma presents with concern for asthma exacerbation onset February 28, not improving with her ProAir inhaler.  Denies fevers, chills, sick contacts.  No prior hospitalizations related to asthma.  Language interpreter used: spanish.  Asthma       Home Medications Prior to Admission medications   Medication Sig Start Date End Date Taking? Authorizing Provider  albuterol (PROVENTIL) (2.5 MG/3ML) 0.083% nebulizer solution Take 3 mLs (2.5 mg total) by nebulization every 6 (six) hours as needed for wheezing or shortness of breath. 10/27/22  Yes Tacy Learn, PA-C  albuterol (VENTOLIN HFA) 108 (90 Base) MCG/ACT inhaler Inhale 1-2 puffs into the lungs every 6 (six) hours as needed for wheezing or shortness of breath. 10/27/22  Yes Tacy Learn, PA-C  ipratropium-albuterol (DUONEB) 0.5-2.5 (3) MG/3ML SOLN Take 3 mLs by nebulization every 6 (six) hours as needed. 10/27/22  Yes Tacy Learn, PA-C  predniSONE (DELTASONE) 50 MG tablet Take 1 tablet (50 mg total) by mouth daily for 5 days. 10/27/22 11/01/22 Yes Tacy Learn, PA-C  ranitidine (ZANTAC) 150 MG tablet Take 150 mg by mouth daily as needed for heartburn.     [provider]      Allergies    Patient has no known allergies.    Review of Systems   Review of Systems Negative except as per HPI Physical Exam Updated Vital Signs BP 125/69 (BP Location: Left Arm)   Pulse (!) 134   Temp 99 F (37.2 C) (Oral)   Resp (!) 21   LMP 11/15/2017 (Exact Date)   SpO2 97%  Physical Exam Vitals and nursing note reviewed.  Constitutional:      General: She is not in acute distress.    Appearance: She is well-developed. She is obese. She is not  diaphoretic.  HENT:     Head: Normocephalic and atraumatic.     Nose: Nose normal.  Eyes:     Conjunctiva/sclera: Conjunctivae normal.  Cardiovascular:     Rate and Rhythm: Regular rhythm. Tachycardia present.  Pulmonary:     Effort: Tachypnea and prolonged expiration present.     Breath sounds: Decreased air movement present. Wheezing present.  Musculoskeletal:     Cervical back: Neck supple.     Right lower leg: No edema.     Left lower leg: No edema.  Skin:    General: Skin is warm and dry.     Findings: No erythema or rash.  Neurological:     Mental Status: She is alert and oriented to person, place, and time.  Psychiatric:        Behavior: Behavior normal.     ED Results / Procedures / Treatments   Labs (all labs ordered are listed, but only abnormal results are displayed) Labs Reviewed - No data to display  EKG None  Radiology No results found.  Procedures .Critical Care  Performed by: Tacy Learn, PA-C Authorized by: Tacy Learn, PA-C   Critical care provider statement:    Critical care time (minutes):  30   Critical care was time spent personally by me on the following activities:  Development of treatment plan with patient or surrogate, discussions with consultants, evaluation  of patient's response to treatment, examination of patient, ordering and review of laboratory studies, ordering and review of radiographic studies, ordering and performing treatments and interventions, pulse oximetry, re-evaluation of patient's condition and review of old charts     Medications Ordered in ED Medications  ipratropium-albuterol (DUONEB) 0.5-2.5 (3) MG/3ML nebulizer solution 3 mL (3 mLs Nebulization Given 10/26/22 2212)  predniSONE (DELTASONE) tablet 60 mg (60 mg Oral Given 10/26/22 2212)  ipratropium-albuterol (DUONEB) 0.5-2.5 (3) MG/3ML nebulizer solution 3 mL (3 mLs Nebulization Given 10/27/22 0223)  acetaminophen (TYLENOL) tablet 650 mg (650 mg Oral Given 10/27/22  0223)  magnesium sulfate IVPB 2 g 50 mL (2 g Intravenous New Bag/Given 10/27/22 0250)  albuterol (PROVENTIL) (2.5 MG/3ML) 0.083% nebulizer solution (10 mg/hr Nebulization Given 10/27/22 0242)    ED Course/ Medical Decision Making/ A&P                             Medical Decision Making Risk OTC drugs. Prescription drug management.   42 year old female with history of asthma presents with exacerbation.  Patient is using her ProAir inhaler without improvement with last use at time of exam.  She is provided with DuoNeb and prednisone in triage with some improvement initially however by the time she is moved to a room she is having worsening of condition with concern for respiratory distress.  Patient is provided with DuoNeb, continuous albuterol nebulizer as well as magnesium.  Improved after duoneb and albuterol continuous x 1 hour. Completing magnesium, will plan to reassess and monitor.   Medications completed, patient reports significant improvement and would like to be discharged. Offered admission or further observation time in the ER, requests dc. Patient has a nebulizer at home, rx for albuterol and duo neb sent to patient's pharmacy. Also prescription for prednisone. Advised to return to the ER for any worsening or concerning symptoms. Patient verbalizes understanding of plan of care.         Final Clinical Impression(s) / ED Diagnoses Final diagnoses:  Moderate persistent asthma with exacerbation    Rx / DC Orders ED Discharge Orders          Ordered    predniSONE (DELTASONE) 50 MG tablet  Daily        10/27/22 0449    ipratropium-albuterol (DUONEB) 0.5-2.5 (3) MG/3ML SOLN  Every 6 hours PRN        10/27/22 0449    albuterol (PROVENTIL) (2.5 MG/3ML) 0.083% nebulizer solution  Every 6 hours PRN        10/27/22 0449    albuterol (VENTOLIN HFA) 108 (90 Base) MCG/ACT inhaler  Every 6 hours PRN        10/27/22 0449              Tacy Learn, PA-C 10/27/22  0459    Ripley Fraise, MD 10/27/22 [phone]

## 2022-10-27 NOTE — Discharge Instructions (Addendum)
Medicamentos segn sea necesario segn lo prescrito. Prednisona diariamente segn lo prescrito. Vuelva a consultar con su mdico, quien le proporcionar informacin clnica para [facility] un seguimiento si es necesario. Regrese a la sala de emergencias si los sntomas empeoran o son preocupantes.   Medications as needed as prescribed. Prednisone daily as prescribed. Recheck with your doctor, provided with clinic information for follow up if needed. Return to the ER for worsening or concerning symptoms.

## 2023-06-07 ENCOUNTER — Telehealth: Payer: Self-pay

## 2023-06-07 NOTE — Telephone Encounter (Signed)
Attempted wellness call and follow up of [facility] client, interpeter services used. No answer unable to leave a vm.  Francee Nodal RN [employer]

## 2023-09-28 ENCOUNTER — Other Ambulatory Visit: Payer: Self-pay | Admitting: Nurse Practitioner

## 2023-09-28 DIAGNOSIS — Z1231 Encounter for screening mammogram for malignant neoplasm of breast: Secondary | ICD-10-CM

## 2023-10-08 ENCOUNTER — Encounter (HOSPITAL_COMMUNITY): Payer: Self-pay

## 2023-10-08 ENCOUNTER — Ambulatory Visit (HOSPITAL_COMMUNITY)
Admission: RE | Admit: 2023-10-08 | Discharge: 2023-10-08 | Disposition: A | Discharge status: Home / Self Care | Payer: Self-pay | Source: Ambulatory Visit | Attending: Nurse Practitioner | Admitting: Nurse Practitioner

## 2023-10-08 DIAGNOSIS — Z1231 Encounter for screening mammogram for malignant neoplasm of breast: Secondary | ICD-10-CM | POA: Insufficient documentation

## 2024-01-17 ENCOUNTER — Encounter (HOSPITAL_BASED_OUTPATIENT_CLINIC_OR_DEPARTMENT_OTHER): Payer: Self-pay

## 2024-01-17 ENCOUNTER — Other Ambulatory Visit: Payer: Self-pay

## 2024-01-17 ENCOUNTER — Emergency Department (HOSPITAL_BASED_OUTPATIENT_CLINIC_OR_DEPARTMENT_OTHER): Payer: Self-pay

## 2024-01-17 ENCOUNTER — Emergency Department (HOSPITAL_BASED_OUTPATIENT_CLINIC_OR_DEPARTMENT_OTHER)
Admission: EM | Admit: 2024-01-17 | Discharge: 2024-01-17 | Disposition: A | Discharge status: Home / Self Care | Payer: Self-pay | Attending: Emergency Medicine | Admitting: Emergency Medicine

## 2024-01-17 DIAGNOSIS — J45909 Unspecified asthma, uncomplicated: Secondary | ICD-10-CM | POA: Insufficient documentation

## 2024-01-17 DIAGNOSIS — R42 Dizziness and giddiness: Secondary | ICD-10-CM | POA: Insufficient documentation

## 2024-01-17 LAB — CBC
HCT: 40.3 % (ref 36.0–46.0)
Hemoglobin: 13.4 g/dL (ref 12.0–15.0)
MCH: 29 pg (ref 26.0–34.0)
MCHC: 33.3 g/dL (ref 30.0–36.0)
MCV: 87.2 fL (ref 80.0–100.0)
Platelets: 351 10*3/uL (ref 150–400)
RBC: 4.62 MIL/uL (ref 3.87–5.11)
RDW: 13.9 % (ref 11.5–15.5)
WBC: 6.3 10*3/uL (ref 4.0–10.5)
nRBC: 0 % (ref 0.0–0.2)

## 2024-01-17 LAB — COMPREHENSIVE METABOLIC PANEL WITH GFR
ALT: 16 U/L (ref 0–44)
AST: 20 U/L (ref 15–41)
Albumin: 4.2 g/dL (ref 3.5–5.0)
Alkaline Phosphatase: 86 U/L (ref 38–126)
Anion gap: 16 — ABNORMAL HIGH (ref 5–15)
BUN: 11 mg/dL (ref 6–20)
CO2: 18 mmol/L — ABNORMAL LOW (ref 22–32)
Calcium: 9.2 mg/dL (ref 8.9–10.3)
Chloride: 104 mmol/L (ref 98–111)
Creatinine, Ser: 0.65 mg/dL (ref 0.44–1.00)
GFR, Estimated: >60 mL/min (ref >60)
Glucose, Bld: 89 mg/dL (ref 70–99)
Potassium: 4.1 mmol/L (ref 3.5–5.1)
Sodium: 138 mmol/L (ref 135–145)
Total Bilirubin: 0.4 mg/dL (ref 0.0–1.2)
Total Protein: 7.6 g/dL (ref 6.5–8.1)

## 2024-01-17 LAB — HCG, QUANTITATIVE, PREGNANCY: hCG, Beta Chain, Quant, S: <1 m[IU]/mL (ref <5)

## 2024-01-17 LAB — CBG MONITORING, ED: Glucose-Capillary: 93 mg/dL (ref 70–99)

## 2024-01-17 MED ORDER — MECLIZINE HCL 25 MG PO TABS
25.0000 mg | ORAL_TABLET | Freq: Once | ORAL | Status: AC
  Administered 2024-01-17: 25 mg via ORAL
  Filled 2024-01-17: qty 1

## 2024-01-17 MED ORDER — SODIUM CHLORIDE 0.9 % IV BOLUS
1000.0000 mL | Freq: Once | INTRAVENOUS | Status: AC
  Administered 2024-01-17: 1000 mL via INTRAVENOUS

## 2024-01-17 MED ORDER — ONDANSETRON HCL 4 MG/2ML IJ SOLN
4.0000 mg | Freq: Once | INTRAMUSCULAR | Status: AC
  Administered 2024-01-17: 4 mg via INTRAVENOUS
  Filled 2024-01-17: qty 2

## 2024-01-17 MED ORDER — ONDANSETRON 4 MG PO TBDP: 4.0000 mg | ORAL_TABLET | Freq: Four times a day (QID) | ORAL | 0 refills | Status: AC | PRN

## 2024-01-17 MED ORDER — MECLIZINE HCL 25 MG PO TABS: 25.0000 mg | ORAL_TABLET | Freq: Three times a day (TID) | ORAL | 0 refills | Status: AC | PRN

## 2024-01-17 NOTE — ED Triage Notes (Signed)
 Patient arrives POV with complaints of increased dizziness and nausea x2 days.

## 2024-01-17 NOTE — Discharge Instructions (Signed)
 You can use the dissolvable nausea medication (zofran  (ondansetron )) under your tongue up to every 6 hours, as well as the dizziness medication (antivert (meclizine)) up to three times a day as needed for nausea.  Please follow-up with the neurologist, as we discussed they should actually give you a call to schedule an appointment sometime in the next 1 to 2 weeks, if your symptoms significantly worsen I recommend that you return for further evaluation and management in the emergency department.  You may want to go to one of our facilities that has an MRI such as [facility] or [facility].

## 2024-01-17 NOTE — ED Provider Notes (Signed)
 [place] EMERGENCY DEPARTMENT AT [facility] Provider Note   CSN: [id number] Arrival date & time: 01/17/24  1431     History  Chief Complaint  Patient presents with   Dizziness   Nausea    Debra Hurley is a 43 y.o. female with past medical history significant for asthma, pyelonephritis, who presents with concern for dizziness, mild headache, nausea for the last 2 days.  She reports that does not seem to be with head movements that sometimes it happens when he is laying flat in bed.  No recent head injury.  No history of migraines.  No numbness, tingling, double vision.   Dizziness      Home Medications Prior to Admission medications   Medication Sig Start Date End Date Taking? Authorizing Provider  meclizine (ANTIVERT) 25 MG tablet Take 1 tablet (25 mg total) by mouth 3 (three) times daily as needed for dizziness. 01/17/24  Yes Rasheema Truluck H, PA-C  ondansetron  (ZOFRAN -ODT) 4 MG disintegrating tablet Take 1 tablet (4 mg total) by mouth every 6 (six) hours as needed for nausea or vomiting. 01/17/24  Yes Dejuana Weist H, PA-C  albuterol  (PROVENTIL ) (2.5 MG/3ML) 0.083% nebulizer solution Take 3 mLs (2.5 mg total) by nebulization every 6 (six) hours as needed for wheezing or shortness of breath. 10/27/22   Darlis Eisenmenger, PA-C  albuterol  (VENTOLIN  HFA) 108 (90 Base) MCG/ACT inhaler Inhale 1-2 puffs into the lungs every 6 (six) hours as needed for wheezing or shortness of breath. 10/27/22   Darlis Eisenmenger, PA-C  ipratropium-albuterol  (DUONEB) 0.5-2.5 (3) MG/3ML SOLN Take 3 mLs by nebulization every 6 (six) hours as needed. 10/27/22   Darlis Eisenmenger, PA-C  ranitidine (ZANTAC) 150 MG tablet Take 150 mg by mouth daily as needed for heartburn.     [provider]      Allergies    Patient has no known allergies.    Review of Systems   Review of Systems  Neurological:  Positive for dizziness.  All other systems reviewed and are  negative.   Physical Exam Updated Vital Signs BP 120/67   Pulse 71   Temp 97.8 F (36.6 C)   Resp 16   Ht 4\' 9"  (1.448 m)   Wt 75.1 kg   SpO2 99%   BMI 35.83 kg/m  Physical Exam Vitals and nursing note reviewed.  Constitutional:      General: She is not in acute distress.    Appearance: Normal appearance.  HENT:     Head: Normocephalic and atraumatic.     Right Ear: Tympanic membrane normal. There is no impacted cerumen.     Left Ear: Tympanic membrane normal. There is no impacted cerumen.  Eyes:     General:        Right eye: No discharge.        Left eye: No discharge.  Cardiovascular:     Rate and Rhythm: Normal rate and regular rhythm.     Heart sounds: No murmur heard.    No friction rub. No gallop.  Pulmonary:     Effort: Pulmonary effort is normal.     Breath sounds: Normal breath sounds.  Abdominal:     General: Bowel sounds are normal.     Palpations: Abdomen is soft.  Skin:    General: Skin is warm and dry.     Capillary Refill: Capillary refill takes less than 2 seconds.  Neurological:     Mental Status: She is alert and oriented to  person, place, and time.     Comments: Cranial nerves II through XII grossly intact.  Intact finger-nose, intact heel-to-shin.  Romberg negative, gait normal.  Alert and oriented x3.  Moves all 4 limbs spontaneously, normal coordination.  No pronator drift.  Intact strength 5 out of 5 bilateral upper and lower extremities.  Psychiatric:        Mood and Affect: Mood normal.        Behavior: Behavior normal.     ED Results / Procedures / Treatments   Labs (all labs ordered are listed, but only abnormal results are displayed) Labs Reviewed  COMPREHENSIVE METABOLIC PANEL WITH GFR - Abnormal; Notable for the following components:      Result Value   CO2 18 (*)    Anion gap 16 (*)    All other components within normal limits  CBC  HCG, QUANTITATIVE, PREGNANCY  CBG MONITORING, ED    EKG EKG  Interpretation Date/Time:  Thursday Jan 17 2024 14:51:02 EDT Ventricular Rate:  81 PR Interval:  156 QRS Duration:  76 QT Interval:  372 QTC Calculation: 432 R Axis:   80  Text Interpretation: Normal sinus rhythm Normal ECG improved rate from prior 2/24 Confirmed by Racheal Buddle [phone]) on 01/17/2024 3:00:28 PM  Radiology CT Head Wo Contrast Result Date: 01/17/2024 CLINICAL DATA:  Headache, neuro deficit Increasing dizziness and nausea. EXAM: CT HEAD WITHOUT CONTRAST TECHNIQUE: Contiguous axial images were obtained from the base of the skull through the vertex without intravenous contrast. RADIATION DOSE REDUCTION: This exam was performed according to the departmental dose-optimization program which includes automated exposure control, adjustment of the mA and/or kV according to patient size and/or use of iterative reconstruction technique. COMPARISON:  Remote head CT 08/20/2011 FINDINGS: Brain: No intracranial hemorrhage, mass effect, or midline shift. No hydrocephalus. The basilar cisterns are patent. No evidence of territorial infarct or acute ischemia. No extra-axial or intracranial fluid collection. Vascular: No hyperdense vessel or unexpected calcification. Skull: Normal. Negative for fracture or focal lesion. Sinuses/Orbits: Paranasal sinuses and mastoid air cells are clear. The visualized orbits are unremarkable. Other: None. IMPRESSION: Normal head CT. Electronically Signed   By: Chadwick Colonel M.D.   On: 01/17/2024 18:30    Procedures Procedures    Medications Ordered in ED Medications  sodium chloride  0.9 % bolus 1,000 mL (0 mLs Intravenous Stopped 01/17/24 1837)  meclizine (ANTIVERT) tablet 25 mg (25 mg Oral Given 01/17/24 1720)  ondansetron  (ZOFRAN ) injection 4 mg (4 mg Intravenous Given 01/17/24 1719)    ED Course/ Medical Decision Making/ A&P                                 Medical Decision Making Amount and/or Complexity of Data Reviewed Labs: ordered. Radiology:  ordered.  Risk Prescription drug management.   This patient is a 43 y.o. female  who presents to the ED for concern of dizziness.   Differential diagnoses prior to evaluation: The emergent differential diagnosis includes, but is not limited to,  BPPV, vestibular migraine, head trauma, AVM, intracranial tumor, multiple sclerosis, drug-related, CVA, orthostatic hypotension, sepsis, hypoglycemia, electrolyte disturbance, anemia, anxiety . This is not an exhaustive differential.   Past Medical History / Co-morbidities / Social History: Tachycardia, asthma  Physical Exam: Physical exam performed. The pertinent findings include: No focal neurologic deficits, vital signs stable, her dizziness is not reproducible with specific head motion.  Lab Tests/Imaging studies: I personally interpreted labs/imaging  and the pertinent results include: CBC unremarkable, negative pregnancy test, normal glucose, CMP with mild bicarb deficit leading to anion gap of 16, otherwise unremarkable.  Suspect likely secondary to vomiting.  CT head without contrast with no intracranial abnormality. I agree with the radiologist interpretation.  Cardiac monitoring: EKG obtained and interpreted by myself and attending physician which shows: Normal sinus rhythm, no significant change from baseline   Medications: I ordered medication including fluid bolus, Antivert, Zofran  for dizziness, on reevaluation patient with mild improvement of symptoms but still having some persistent dizziness and nausea..  I have reviewed the patients home medicines and have made adjustments as needed.   Disposition: After consideration of the diagnostic results and the patients response to treatment, I feel that patient with unclear etiology for her dizziness, discussed that given her symptoms do not have a clear representation of BPPV the next step in her workup would be to be transferred to to further evaluate her dizziness, given the duration of  the symptoms did discuss with the patient that if she does not wish to be transferred for MR I think it would be reasonable to follow-up closely outpatient with neurology, she declines transfer for MRI and is comfortable with close outpatient follow-up.  Ambulatory referral to neurology given.   emergency department workup does not suggest an emergent condition requiring admission or immediate intervention beyond what has been performed at this time. The plan is: as above. The patient is safe for discharge and has been instructed to return immediately for worsening symptoms, change in symptoms or any other concerns.  Final Clinical Impression(s) / ED Diagnoses Final diagnoses:  Dizziness    Rx / DC Orders ED Discharge Orders          Ordered    Ambulatory referral to Neurology       Comments: An appointment is requested in approximately: 2 weeks   01/17/24 1859    ondansetron  (ZOFRAN -ODT) 4 MG disintegrating tablet  Every 6 hours PRN        01/17/24 1859    meclizine (ANTIVERT) 25 MG tablet  3 times daily PRN        01/17/24 1859              Sharlet Notaro H, PA-C 01/17/24 1913    Tonya Fredrickson, MD 01/18/24 1005

## 2024-05-05 ENCOUNTER — Telehealth: Payer: Self-pay | Admitting: Neurology

## 2024-05-05 ENCOUNTER — Encounter: Payer: Self-pay | Admitting: Neurology

## 2024-05-05 ENCOUNTER — Ambulatory Visit (INDEPENDENT_AMBULATORY_CARE_PROVIDER_SITE_OTHER): Payer: Self-pay | Admitting: Neurology

## 2024-05-05 VITALS — BP 116/78 | HR 87 | Ht 59.0 in | Wt 158.8 lb

## 2024-05-05 DIAGNOSIS — R42 Dizziness and giddiness: Secondary | ICD-10-CM

## 2024-05-05 DIAGNOSIS — H5462 Unqualified visual loss, left eye, normal vision right eye: Secondary | ICD-10-CM

## 2024-05-05 DIAGNOSIS — H547 Unspecified visual loss: Secondary | ICD-10-CM

## 2024-05-05 NOTE — Telephone Encounter (Signed)
MRI order sent to [place] [phone]

## 2024-05-05 NOTE — Progress Notes (Signed)
 [facility]  PATIENT: Debra Hurley DOB: 1981/06/22  REQUESTING CLINICIAN: Prosperi, Christian H, * HISTORY FROM: Patient with the help of interpretor  REASON FOR VISIT: Dizziness   HISTORICAL  CHIEF COMPLAINT:  Chief Complaint  Patient presents with   New Patient (Initial Visit)    Pt in room 12. Luis interpreter in room. Internal referral for Dizziness. Went to ER in May for dizziness, takes Meclizine  which helps with dizziness, but causes upset stomach. Dizziness is all the time.    HISTORY OF PRESENT ILLNESS:  Discussed the use of AI scribe software for clinical note transcription with the patient, who gave verbal consent to proceed.  Debra Hurley is a 43 year old female with history of obesity and hypothyroidism who presents with persistent dizziness. She was advised to see a neurologist for further evaluation after a head CT showed no abnormalities.  She has been experiencing persistent dizziness since May, which began three to four days before she visited the emergency room. The dizziness is described as a sensation of 'everything moving around' and sometimes as 'dizzy but not spinning.' It is often accompanied by nausea, especially when the room spins, but she has not vomited since receiving medication for nausea at the emergency room. The dizziness affects her ability to walk, as she feels she might fall due to a lack of equilibrium. There are days without dizziness, during which she stops taking her medication, but the dizziness returns after three days without it.  The patient reports that a head CT was performed at the hospital and she was told it was normal. She experiences room spinning episodes approximately three times a week, primarily in the mornings, lasting about 40 to 50 seconds each time.  She has a history of thyroid problems and takes levothyroxine, although she is unsure of the exact name of the medication. She takes it in  the morning as instructed.  She reports significant vision issues, particularly in her left eye, which she cannot see out of clearly. She has not seen an eye doctor in three to four years and has had difficulty with glasses in the past, experiencing double vision and worsened sight with them. Her vision problems began around the age of 84, approximately 20 years ago, and she has not sustained any known injuries to her left eye.       OTHER MEDICAL CONDITIONS: Hypothyroidism , Obesity    REVIEW OF SYSTEMS: Full 14 system review of systems performed and negative with exception of: As noted in the HPI  ALLERGIES: No Known Allergies  HOME MEDICATIONS: Outpatient Medications Prior to Visit  Medication Sig Dispense Refill   albuterol  (PROVENTIL ) (2.5 MG/3ML) 0.083% nebulizer solution Take 3 mLs (2.5 mg total) by nebulization every 6 (six) hours as needed for wheezing or shortness of breath. 75 mL 12   albuterol  (VENTOLIN  HFA) 108 (90 Base) MCG/ACT inhaler Inhale 1-2 puffs into the lungs every 6 (six) hours as needed for wheezing or shortness of breath. 18 g 6   ipratropium-albuterol  (DUONEB) 0.5-2.5 (3) MG/3ML SOLN Take 3 mLs by nebulization every 6 (six) hours as needed. 360 mL 0   meclizine  (ANTIVERT ) 25 MG tablet Take 1 tablet (25 mg total) by mouth 3 (three) times daily as needed for dizziness. 30 tablet 0   ondansetron  (ZOFRAN -ODT) 4 MG disintegrating tablet Take 1 tablet (4 mg total) by mouth every 6 (six) hours as needed for nausea or vomiting. 20 tablet 0   ranitidine (ZANTAC) 150 MG tablet  Take 150 mg by mouth daily as needed for heartburn.      No facility-administered medications prior to visit.    PAST MEDICAL HISTORY: Past Medical History:  Diagnosis Date   Asthma    Elevated cholesterol    Thyroid disease     PAST SURGICAL HISTORY: History reviewed. No pertinent surgical history.  FAMILY HISTORY: History reviewed. No pertinent family history.  SOCIAL HISTORY: Social  History   Socioeconomic History   Marital status: Single    Spouse name: Not on file   Number of children: Not on file   Years of education: Not on file   Highest education level: Not on file  Occupational History   Not on file  Tobacco Use   Smoking status: Never   Smokeless tobacco: Never  Vaping Use   Vaping status: Never Used  Substance and Sexual Activity   Alcohol use: No   Drug use: No   Sexual activity: Yes  Other Topics Concern   Not on file  Social History Narrative   Not on file   Social Drivers of Health   Financial Resource Strain: Not on file  Food Insecurity: Not on file  Transportation Needs: Not on file  Physical Activity: Not on file  Stress: Not on file  Social Connections: Not on file  Intimate Partner Violence: Not on file    PHYSICAL EXAM  GENERAL EXAM/CONSTITUTIONAL: Vitals:  Vitals:   05/05/24 1405  BP: 116/78  Pulse: 87  Weight: 158 lb 12.8 oz (72 kg)  Height: 4' 11 (1.499 m)   Body mass index is 32.07 kg/m. Wt Readings from Last 3 Encounters:  05/05/24 158 lb 12.8 oz (72 kg)  01/17/24 165 lb 9.1 oz (75.1 kg)  07/29/18 150 lb (68 kg)   Patient is in no distress; well developed, nourished and groomed; neck is supple  MUSCULOSKELETAL: Gait, strength, tone, movements noted in Neurologic exam below  NEUROLOGIC: MENTAL STATUS:      No data to display         awake, alert, oriented to person, place and time recent and remote memory intact normal attention and concentration language fluent, comprehension intact, naming intact fund of knowledge appropriate  CRANIAL NERVE:  2nd, 3rd, 4th, 6th - Visual fields full to confrontation, extraocular muscles intact, no nystagmus. Poor visual acuity left eye  5th - facial sensation symmetric 7th - facial strength symmetric 8th - hearing intact 9th - palate elevates symmetrically, uvula midline 11th - shoulder shrug symmetric 12th - tongue protrusion midline  MOTOR:  normal bulk  and tone, full strength in the BUE, BLE  SENSORY:  normal and symmetric to light touch  COORDINATION:  finger-nose-finger, fine finger movements normal  GAIT/STATION:  normal   DIAGNOSTIC DATA (LABS, IMAGING, TESTING) - I reviewed patient records, labs, notes, testing and imaging myself where available.  Lab Results  Component Value Date   WBC 6.3 01/17/2024   HGB 13.4 01/17/2024   HCT 40.3 01/17/2024   MCV 87.2 01/17/2024   PLT 351 01/17/2024      Component Value Date/Time   NA 138 01/17/2024 1455   K 4.1 01/17/2024 1455   CL 104 01/17/2024 1455   CO2 18 (L) 01/17/2024 1455   GLUCOSE 89 01/17/2024 1455   BUN 11 01/17/2024 1455   CREATININE 0.65 01/17/2024 1455   CALCIUM 9.2 01/17/2024 1455   PROT 7.6 01/17/2024 1455   ALBUMIN 4.2 01/17/2024 1455   AST 20 01/17/2024 1455   ALT 16  01/17/2024 1455   ALKPHOS 86 01/17/2024 1455   BILITOT 0.4 01/17/2024 1455   GFRNONAA >60 01/17/2024 1455   GFRAA >60 07/29/2018 1155   No results found for: CHOL, HDL, LDLCALC, LDLDIRECT, TRIG, CHOLHDL No results found for: YHAJ8R No results found for: VITAMINB12 Lab Results  Component Value Date   TSH 4.018 08/20/2011    Head CT 01/17/2024 Normal head CT.    ASSESSMENT AND PLAN  43 y.o. year old female with hypothyroidism and obesity who is presenting with complaint of dizziness for the past 4 months.  Dizziness and balance disturbance Chronic dizziness and balance disturbance since May, with episodes of vertigo lasting about a minute, occurring approximately three times a week, primarily in the mornings. Associated with nausea and vomiting initially, but controlled with medication. Head CT from emergency visit was normal. Differential includes vestibular dysfunction and visual impairment contributing to balance issues. MRI with contrast is considered to rule out central causes, but cost is a concern due to lack of insurance. 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 No recent eye examination in the past 3-4 years. Possible contribution to dizziness due to imbalance in visual input. No history of trauma to the eye. Difficulty with night vision and driving, experiencing diplopia with lights. - Advise to see an ophthalmologist for a comprehensive eye examination and new glasses prescription - Discuss potential for visual impairment to contribute to dizziness and balance issues         1. Dizziness   2. Visual acuity reduced   3. Decreased vision of left eye      Patient Instructions  Please follow up with PCP for referral to ophthalmology  MRI brain with and without contrast  Will consider Vestibular therapy if MRI Brain negative  Continue current medications  Continue to follow up with PCP  Return if worse   Orders Placed This Encounter  Procedures   MR BRAIN W WO CONTRAST    No orders of the defined types were placed in this encounter.   Return if symptoms worsen or fail to improve.    Pastor Falling, MD 05/05/2024, 2:49 PM  [facility] [address], [address] [place], [place] [address] [phone]

## 2024-05-05 NOTE — Patient Instructions (Addendum)
 Please follow up with PCP for referral to ophthalmology  MRI brain with and without contrast  Will consider Vestibular therapy if MRI Brain negative  Continue current medications  Continue to follow up with PCP  Return if worse
